# Patient Record
Sex: Male | Born: 1990 | Hispanic: No | Marital: Married | Smoking: Light tobacco smoker
Health system: Southern US, Community
[De-identification: ages and names within clinical notes are randomized; demographics above are authoritative.]

## PROBLEM LIST (undated history)

## (undated) DIAGNOSIS — F410 Panic disorder [episodic paroxysmal anxiety] without agoraphobia: Secondary | ICD-10-CM

## (undated) DIAGNOSIS — E079 Disorder of thyroid, unspecified: Secondary | ICD-10-CM

## (undated) HISTORY — PX: TESTICLE SURGERY: SHX794

---
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
## 2011-06-13 ENCOUNTER — Emergency Department (HOSPITAL_COMMUNITY)
Admission: EM | Admit: 2011-06-13 | Discharge: 2011-06-13 | Disposition: A | Discharge status: Home / Self Care | Payer: Self-pay | Attending: Emergency Medicine | Admitting: Emergency Medicine

## 2011-06-13 ENCOUNTER — Encounter (HOSPITAL_COMMUNITY): Payer: Self-pay | Admitting: Family Medicine

## 2011-06-13 ENCOUNTER — Emergency Department (HOSPITAL_COMMUNITY): Payer: Self-pay

## 2011-06-13 DIAGNOSIS — S93409A Sprain of unspecified ligament of unspecified ankle, initial encounter: Secondary | ICD-10-CM | POA: Insufficient documentation

## 2011-06-13 DIAGNOSIS — Z79899 Other long term (current) drug therapy: Secondary | ICD-10-CM | POA: Insufficient documentation

## 2011-06-13 DIAGNOSIS — W010XXA Fall on same level from slipping, tripping and stumbling without subsequent striking against object, initial encounter: Secondary | ICD-10-CM | POA: Insufficient documentation

## 2011-06-13 DIAGNOSIS — M25476 Effusion, unspecified foot: Secondary | ICD-10-CM | POA: Insufficient documentation

## 2011-06-13 DIAGNOSIS — Y9367 Activity, basketball: Secondary | ICD-10-CM | POA: Insufficient documentation

## 2011-06-13 DIAGNOSIS — M25579 Pain in unspecified ankle and joints of unspecified foot: Secondary | ICD-10-CM | POA: Insufficient documentation

## 2011-06-13 DIAGNOSIS — M25473 Effusion, unspecified ankle: Secondary | ICD-10-CM | POA: Insufficient documentation

## 2011-06-13 DIAGNOSIS — F17200 Nicotine dependence, unspecified, uncomplicated: Secondary | ICD-10-CM | POA: Insufficient documentation

## 2011-06-13 DIAGNOSIS — S93401A Sprain of unspecified ligament of right ankle, initial encounter: Secondary | ICD-10-CM

## 2011-06-13 MED ORDER — TRAMADOL HCL 50 MG PO TABS: 50.0000 mg | ORAL_TABLET | Freq: Four times a day (QID) | ORAL | Status: AC | PRN

## 2011-06-13 NOTE — ED Provider Notes (Signed)
Medical screening examination/treatment/procedure(s) were performed by non-physician practitioner and as supervising physician I was immediately available for consultation/collaboration.  Juliet Rude. Rubin Payor, MD 06/13/11 [phone]

## 2011-06-13 NOTE — ED Notes (Signed)
Patient states he twisted his ankle around 5pm. States pain was not bad at time of injury but got worse around 9pm. Pain with movement and weight bearing.

## 2011-06-13 NOTE — ED Provider Notes (Signed)
History     CSN: [id number]  Arrival date & time 06/13/11  0035   First MD Initiated Contact with Patient 06/13/11 0305      Chief Complaint  Patient presents with  . Ankle Injury    (Consider location/radiation/quality/duration/timing/severity/associated sxs/prior treatment) HPI Comments: Patient states he was playing basketball today and slipped on a wet spot twisting.  Right ankle.  There is no deformity or swelling at this time.  He states that he has sprained his ankle.  Multiple times in the past  Patient is a 21 y.o. male presenting with lower extremity injury. The history is provided by the patient.  Ankle Injury This is a new problem. The current episode started today. The problem occurs constantly. The problem has been unchanged. Associated symptoms include joint swelling.    History reviewed. No pertinent past medical history.  Past Surgical History  Procedure Date  . Testicle surgery     No family history on file.  History  Substance Use Topics  . Smoking status: Current Everyday Smoker -- 0.2 packs/day    Types: Cigarettes  . Smokeless tobacco: Not on file  . Alcohol Use: No      Review of Systems  Musculoskeletal: Positive for joint swelling. Negative for back pain.    Allergies  Ceclor  Home Medications   Current Outpatient Rx  Name Route Sig Dispense Refill  . IBUPROFEN 200 MG PO TABS Oral Take 800 mg by mouth every 8 (eight) hours as needed. For pain    . TRAMADOL HCL 50 MG PO TABS Oral Take 1 tablet (50 mg total) by mouth every 6 (six) hours as needed for pain. 15 tablet 0    Pulse 85  Temp 98.8 F (37.1 C)  Resp 18  Ht 5\' 6"  (1.676 m)  Wt 360 lb (163.295 kg)  BMI 58.11 kg/m2  SpO2 98%  Physical Exam  Constitutional: He appears well-developed and well-nourished.  HENT:  Head: Normocephalic.  Cardiovascular: Normal rate.   Musculoskeletal: He exhibits tenderness. He exhibits no edema.       Feet:    ED Course  Procedures  (including critical care time)  Labs Reviewed - No data to display Dg Ankle Complete Right  06/13/2011  *RADIOLOGY REPORT*  Clinical Data: Ankle injury.  Pain.  RIGHT ANKLE - COMPLETE 3+ VIEW  Comparison: None.  Findings: There is no evidence for fracture, subluxation or dislocation.  No worrisome lytic or sclerotic osseous lesion.  IMPRESSION: Normal exam.  Original Report Authenticated By: ERIC A. MANSELL, M.D.     1. Ankle sprain, right, initial encounter       MDM  Ankle sprain        Arman Filter, NP 06/13/11 0334  Arman Filter, NP 06/13/11 [phone]

## 2011-06-13 NOTE — ED Notes (Signed)
Patient transported to X-ray 

## 2011-06-13 NOTE — ED Notes (Signed)
Patient discharge via wheelchair. Respirations equal and unlabored. Skin warm and dry. No acute distress noted. 

## 2011-09-01 ENCOUNTER — Encounter (HOSPITAL_COMMUNITY): Payer: Self-pay | Admitting: *Deleted

## 2011-09-01 ENCOUNTER — Emergency Department (HOSPITAL_COMMUNITY)
Admission: EM | Admit: 2011-09-01 | Discharge: 2011-09-02 | Disposition: A | Discharge status: Home / Self Care | Payer: BC Managed Care – PPO | Attending: Emergency Medicine | Admitting: Emergency Medicine

## 2011-09-01 DIAGNOSIS — Y9312 Activity, springboard and platform diving: Secondary | ICD-10-CM | POA: Insufficient documentation

## 2011-09-01 DIAGNOSIS — R42 Dizziness and giddiness: Secondary | ICD-10-CM | POA: Insufficient documentation

## 2011-09-01 DIAGNOSIS — F17200 Nicotine dependence, unspecified, uncomplicated: Secondary | ICD-10-CM | POA: Insufficient documentation

## 2011-09-01 DIAGNOSIS — S060XAA Concussion with loss of consciousness status unknown, initial encounter: Secondary | ICD-10-CM | POA: Insufficient documentation

## 2011-09-01 DIAGNOSIS — W2209XA Striking against other stationary object, initial encounter: Secondary | ICD-10-CM | POA: Insufficient documentation

## 2011-09-01 DIAGNOSIS — S060X9A Concussion with loss of consciousness of unspecified duration, initial encounter: Secondary | ICD-10-CM

## 2011-09-01 DIAGNOSIS — Y9239 Other specified sports and athletic area as the place of occurrence of the external cause: Secondary | ICD-10-CM | POA: Insufficient documentation

## 2011-09-01 NOTE — ED Notes (Signed)
Pt reports hitting his head on the bottom of a pool today approximately 3 pm. Pt struck the back of his head, no laceration, hematoma noted. Pt denies LOC. Pt now reports headache and nausea onset at 8 pm. Pt denies vomiting. Pt reports dizziness and weakness. Pt is A&O x4. Skin is warm and dry. Pain is 5/10. Pt reports taking 2 extra strength tylenol two hours ago.

## 2011-09-02 ENCOUNTER — Emergency Department (HOSPITAL_COMMUNITY): Payer: BC Managed Care – PPO

## 2011-09-02 NOTE — ED Provider Notes (Signed)
History     CSN: [id number]  Arrival date & time 09/01/11  2209   First MD Initiated Contact with Patient 09/01/11 2350      No chief complaint on file.   (Consider location/radiation/quality/duration/timing/severity/associated sxs/prior treatment) HPI  Patient presents to the emergency department with complaints of feeling dizzy, having a headache, and head injury. The patient was swimming earlier today his head on the bottom of the pool after diving in. He denies loss of consciousness but since then he states that he has been feeling dizzy in a week. His parents are here with him and say that he's been acting insulin where. Patient denies having hematoma or laceration over the site of impact. He denies having any pain in his neck or elsewhere. The patient is ambulatory. The patient is answering all my questions appropriately and is alert and oriented x3.  History reviewed. No pertinent past medical history.  Past Surgical History  Procedure Date  . Testicle surgery     History reviewed. No pertinent family history.  History  Substance Use Topics  . Smoking status: Current Everyday Smoker -- 0.2 packs/day    Types: Cigarettes  . Smokeless tobacco: Not on file  . Alcohol Use: No      Review of Systems  HEENT: denies blurry vision or change in hearing PULMONARY: Denies difficulty breathing and SOB CARDIAC: denies chest pain or heart palpitations MUSCULOSKELETAL:  denies being unable to ambulate ABDOMEN AL: denies abdominal pain GU: denies loss of bowel or urinary control NEURO: denies numbness and tingling in extremities SKIN: no new rashes PSYCH: patient denies anxiety or depression. NECK: Pt denies having neck pain    Allergies  Ceclor  Home Medications   Current Outpatient Rx  Name Route Sig Dispense Refill  . ACETAMINOPHEN 500 MG PO TABS Oral Take 1,000 mg by mouth every 6 (six) hours as needed. Pain.    Marland Kitchen CETIRIZINE HCL 10 MG PO TABS Oral Take 10 mg by  mouth daily.      BP 149/97  Pulse 99  Temp 99.5 F (37.5 C) (Oral)  Resp 18  SpO2 100%  Physical Exam  Nursing note and vitals reviewed. Constitutional: He is oriented to person, place, and time. He appears well-developed and well-nourished. No distress.  HENT:  Head: Normocephalic. Head is with contusion. Head is without raccoon's eyes, without Battle's sign, without abrasion and without laceration. Hair is normal.    Eyes: Pupils are equal, round, and reactive to light.  Neck: Normal range of motion. Neck supple.  Cardiovascular: Normal rate and regular rhythm.   Pulmonary/Chest: Effort normal.  Abdominal: Soft.  Neurological: He is alert and oriented to person, place, and time. He has normal strength. No cranial nerve deficit (3-12 intact) or sensory deficit. He displays a negative Romberg sign.  Skin: Skin is warm and dry.    ED Course  Procedures (including critical care time)  Labs Reviewed - No data to display No results found.   1. Concussion       MDM  Pt most likely has concussion but parents request head CT and I do not think that is completely unreasonable do to headache, vomiting and weakness.    Patient head CT was normal, will treat as a concussion. I have advised parents to keep a close eye on him. He needs to stay away from contact activities for two weeks.  Pt needs to follow-up with PCP in the next 7-10 days for re-evaluation.  Pt has been  advised of the symptoms that warrant their return to the ED. Patient has voiced understanding and has agreed to follow-up with the PCP or specialist.           Dorthula Matas, PA 09/02/11 [phone]

## 2011-09-03 NOTE — ED Provider Notes (Signed)
Medical screening examination/treatment/procedure(s) were performed by non-physician practitioner and as supervising physician I was immediately available for consultation/collaboration.    Vida Roller, MD 09/03/11 [phone]

## 2011-09-18 ENCOUNTER — Emergency Department (HOSPITAL_COMMUNITY): Payer: BC Managed Care – PPO

## 2011-09-18 ENCOUNTER — Emergency Department (HOSPITAL_COMMUNITY)
Admission: EM | Admit: 2011-09-18 | Discharge: 2011-09-18 | Disposition: A | Discharge status: Home / Self Care | Payer: BC Managed Care – PPO | Attending: Emergency Medicine | Admitting: Emergency Medicine

## 2011-09-18 ENCOUNTER — Encounter (HOSPITAL_COMMUNITY): Payer: Self-pay | Admitting: Emergency Medicine

## 2011-09-18 DIAGNOSIS — F410 Panic disorder [episodic paroxysmal anxiety] without agoraphobia: Secondary | ICD-10-CM

## 2011-09-18 DIAGNOSIS — F17200 Nicotine dependence, unspecified, uncomplicated: Secondary | ICD-10-CM | POA: Insufficient documentation

## 2011-09-18 LAB — POCT I-STAT, CHEM 8
BUN: 9 mg/dL (ref 6–23)
Calcium, Ion: 1.16 mmol/L (ref 1.12–1.23)
HCT: 45 % (ref 39.0–52.0)
Hemoglobin: 15.3 g/dL (ref 13.0–17.0)
Sodium: 140 mEq/L (ref 135–145)
TCO2: 21 mmol/L (ref 0–100)

## 2011-09-18 MED ORDER — ACETAMINOPHEN 500 MG PO TABS
1000.0000 mg | ORAL_TABLET | Freq: Once | ORAL | Status: AC
  Administered 2011-09-18: 1000 mg via ORAL
  Filled 2011-09-18: qty 2

## 2011-09-18 NOTE — ED Provider Notes (Signed)
Date: 09/18/2011  Rate: 86  Rhythm: normal sinus rhythm  QRS Axis: normal  Intervals: normal  ST/T Wave abnormalities: normal  Conduction Disutrbances:none  Narrative Interpretation: Borderline left atrial hypertrophy, otherwise normal ECG. No prior ECG available for comparison.  Old EKG Reviewed: none available    Dione Booze, MD 09/18/11 [phone]

## 2011-09-18 NOTE — ED Notes (Signed)
Pt presenting to ed with c/o panic attack with chest pain, headache pain, and some nausea, pt denies vomiting. Pt states positive shortness of breath. Pt is alert and oriented at this time

## 2011-09-18 NOTE — ED Provider Notes (Signed)
History     CSN: [id number]  Arrival date & time 09/18/11  1259   First MD Initiated Contact with Patient 09/18/11 1403      Chief Complaint  Patient presents with  . Panic Attack  . Headache    (Consider location/radiation/quality/duration/timing/severity/associated sxs/prior treatment) HPI Comments: Pt presents with shortness of breath. Pt reports that this morning he woke up around 10:30am and had a "funny, fuzzy" feeling in his head and chest, as well as mild nausea. He states he felt slightly lightheaded. 1 hour later, patient states he was hanging around the house and felt a sudden burning in the center of his chest as well as shortness of breath and felt as though his heart was racing. The chest pain did not radiate anywhere and lasted for about 15 minutes. He states he felt like he could not take a deep breath and fill his chest with air, but denies any pain with deep inspiration. The patient thought that he may be having a panic attack, he states that he has had similar episodes of shortness of breath and palpitations, but has not experienced the chest pain before.  Pt notes he does not carry a diagnosis of anxiety but his mother believes he is an anxious person.  Pt also has environmental allergies, but does not know to what.  States this occurs approximately 3-4 times/month and can happen at any time of day, at rest, and no matter what he is doing.  Pt walked around outside and the chest pain subsided but the shortness of breath and nausea lasted until about 1:00pm. Upon arriving to ED, pt states his symptoms had resolved but has acquired a mild, constant headache in the back of his head. Pt reports diarrhea for the past week, about 2 times per day. Denies fever, chills, leg pain or swelling, constipation, abdominal pain, vomiting, urinary symptoms, blood in stool or urine.  Pt has had no recent immobilization, no personal or family hx PE.  No family hx CAD.    Patient is a 21 y.o. male  presenting with headaches. The history is provided by the patient.  Headache  Associated symptoms include palpitations, shortness of breath and nausea. Pertinent negatives include no fever and no vomiting.    History reviewed. No pertinent past medical history.  Past Surgical History  Procedure Date  . Testicle surgery     No family history on file.  History  Substance Use Topics  . Smoking status: Current Everyday Smoker -- 0.2 packs/day    Types: Cigarettes  . Smokeless tobacco: Not on file  . Alcohol Use: No      Review of Systems  Constitutional: Negative for fever and chills.  Respiratory: Positive for shortness of breath.   Cardiovascular: Positive for chest pain and palpitations. Negative for leg swelling.  Gastrointestinal: Positive for nausea and diarrhea. Negative for vomiting, abdominal pain, constipation and blood in stool.  Genitourinary: Negative for dysuria, urgency and frequency.  Musculoskeletal: Negative for myalgias.  Neurological: Positive for light-headedness and headaches. Negative for dizziness.    Allergies  Ceclor  Home Medications   Current Outpatient Rx  Name Route Sig Dispense Refill  . IBUPROFEN 200 MG PO TABS Oral Take 200-800 mg by mouth every 6 (six) hours as needed.      BP 135/74  Pulse 94  Temp 99 F (37.2 C) (Oral)  Resp 20  SpO2 97%  Physical Exam  Nursing note and vitals reviewed. Constitutional: He appears well-developed and well-nourished.  No distress.  HENT:  Head: Normocephalic and atraumatic.  Neck: Neck supple.  Cardiovascular: Normal rate, regular rhythm and normal pulses.        No peripheral edema  Pulmonary/Chest: Effort normal and breath sounds normal. No respiratory distress. He has no wheezes. He has no rales. He exhibits no tenderness.  Abdominal: Soft. He exhibits no distension. There is no tenderness. There is no rebound and no guarding.       Morbidly obese  Neurological: He is alert. He exhibits  normal muscle tone.  Skin: He is not diaphoretic.  Psychiatric: He has a normal mood and affect. His behavior is normal.    ED Course  Procedures (including critical care time)   Labs Reviewed  POCT I-STAT, CHEM 8   Dg Chest 2 View  09/18/2011  *RADIOLOGY REPORT*  Clinical Data: Chest pain  CHEST - 2 VIEW  Comparison: None.  Findings: The heart and pulmonary vascularity are within normal limits.  The lungs are clear bilaterally.  No acute bony abnormality is seen.  IMPRESSION: No acute abnormality.  Original Report Authenticated By: Phillips Odor, M.D.    3:20 PM PERC negative.     Date: 09/18/2011  Rate: 86  Rhythm: normal sinus rhythm  QRS Axis: normal  Intervals: normal  ST/T Wave abnormalities: normal  Conduction Disutrbances: none  Narrative Interpretation:   Old EKG Reviewed: none available   1. Panic attack     MDM  Pt reports recurrent episode of what he believes is a panic attack - occurs 3-4 times/month, has sudden onset of shortness of breath and palpitations, today with chest burning.  Symptoms were resolved prior to arrival to ED.  Patient's EKG and CXR are normal.  Patient is PERC negative, no risk factors or family or personal history for PE.  Pt has no family hx CAD and symptoms were relieved with exertion - doubt CAD.  Pt notes he may have an undiagnosed anxiety disorder, but was not aware of being particularly anxious at the time the event occurred.  I have encouraged close PCP follow up.  Discussed all results with patient.  Pt given return precautions.  Pt verbalizes understanding and agrees with plan.           [place], [place] 09/18/11 1755

## 2011-09-18 NOTE — ED Notes (Signed)
Patient states panic attacks started 7 months ago since separating from his wife-patient calm/cooperative-no complaints at this time

## 2011-09-19 NOTE — ED Provider Notes (Signed)
Medical screening examination/treatment/procedure(s) were performed by non-physician practitioner and as supervising physician I was immediately available for consultation/collaboration.   Leeanne Butters, MD 09/19/11 0720 

## 2017-11-07 ENCOUNTER — Emergency Department (HOSPITAL_COMMUNITY)
Admission: EM | Admit: 2017-11-07 | Discharge: 2017-11-07 | Disposition: A | Discharge status: Home / Self Care | Payer: PRIVATE HEALTH INSURANCE | Attending: Emergency Medicine | Admitting: Emergency Medicine

## 2017-11-07 ENCOUNTER — Encounter (HOSPITAL_COMMUNITY): Payer: Self-pay | Admitting: Emergency Medicine

## 2017-11-07 DIAGNOSIS — R002 Palpitations: Secondary | ICD-10-CM | POA: Insufficient documentation

## 2017-11-07 DIAGNOSIS — F17210 Nicotine dependence, cigarettes, uncomplicated: Secondary | ICD-10-CM | POA: Diagnosis not present

## 2017-11-07 DIAGNOSIS — R946 Abnormal results of thyroid function studies: Secondary | ICD-10-CM | POA: Insufficient documentation

## 2017-11-07 DIAGNOSIS — R7989 Other specified abnormal findings of blood chemistry: Secondary | ICD-10-CM

## 2017-11-07 LAB — CBC WITH DIFFERENTIAL/PLATELET
ABS IMMATURE GRANULOCYTES: 0.1 10*3/uL (ref 0.0–0.1)
BASOS PCT: 1 %
Basophils Absolute: 0.1 10*3/uL (ref 0.0–0.1)
Eosinophils Absolute: 0.1 10*3/uL (ref 0.0–0.7)
Eosinophils Relative: 2 %
HEMATOCRIT: 40.5 % (ref 39.0–52.0)
HEMOGLOBIN: 13.3 g/dL (ref 13.0–17.0)
Immature Granulocytes: 1 %
LYMPHS ABS: 2.2 10*3/uL (ref 0.7–4.0)
LYMPHS PCT: 31 %
MCH: 27.4 pg (ref 26.0–34.0)
MCHC: 32.8 g/dL (ref 30.0–36.0)
MCV: 83.5 fL (ref 78.0–100.0)
MONO ABS: 0.5 10*3/uL (ref 0.1–1.0)
MONOS PCT: 7 %
NEUTROS ABS: 4.1 10*3/uL (ref 1.7–7.7)
Neutrophils Relative %: 58 %
Platelets: 307 10*3/uL (ref 150–400)
RBC: 4.85 MIL/uL (ref 4.22–5.81)
RDW: 13.3 % (ref 11.5–15.5)
WBC: 7 10*3/uL (ref 4.0–10.5)

## 2017-11-07 LAB — BASIC METABOLIC PANEL
ANION GAP: 12 (ref 5–15)
BUN: 11 mg/dL (ref 6–20)
CALCIUM: 9.5 mg/dL (ref 8.9–10.3)
CO2: 22 mmol/L (ref 22–32)
Chloride: 105 mmol/L (ref 98–111)
Creatinine, Ser: 0.85 mg/dL (ref 0.61–1.24)
Glucose, Bld: 101 mg/dL — ABNORMAL HIGH (ref 70–99)
Potassium: 3.5 mmol/L (ref 3.5–5.1)
Sodium: 139 mmol/L (ref 135–145)

## 2017-11-07 LAB — TSH: TSH: 8.141 u[IU]/mL — AB (ref 0.350–4.500)

## 2017-11-07 LAB — I-STAT TROPONIN, ED: Troponin i, poc: 0 ng/mL (ref 0.00–0.08)

## 2017-11-07 LAB — T4, FREE: Free T4: 0.85 ng/dL (ref 0.82–1.77)

## 2017-11-07 NOTE — ED Provider Notes (Signed)
[facility] EMERGENCY DEPARTMENT Provider Note   CSN: [id number] Arrival date & time: 11/07/17  4098     History   Chief Complaint Chief Complaint  Patient presents with  . Palpitations    HPI Xaviar Lunn is a 27 y.o. male.  The history is provided by the patient and medical records. No language interpreter was used.  Palpitations   Associated symptoms include chest pain and shortness of breath. Pertinent negatives include no cough.   Rito Lecomte is a 27 y.o. male  with a PMH of panic attacks who presents to the Emergency Department complaining of palpitations that began around 4 am this morning. Patient states that he was about to go to sleep at 4 this morning, when he suddenly felt very funny - describes this sensation as his chest feeling "light, like weightless".  He thought that he may be having a panic attack, as he has had a few of these in the past, therefore he went to take a warm shower.  He tried to relax, but his symptoms continued to get worse.  He states that he felt jittery, excited, shaking and could not sit still.  He was pacing around the house.  He has a home heart rate monitor that goes on his finger.  He checked it and his heart rate was 145.  Significant other recommended that he come to the emergency department.  He states that the symptoms lasted about 30 minutes to an hour and resolved shortly before entering the emergency department today.  He currently feels a little jittery still, but otherwise normal.  Denies any chest pain currently.  No shortness of breath currently either.  He denies any new medications, supplements or changes in diet such as caffeine or workout drinks.  No recent surgeries/travel/immobilizations.   History reviewed. No pertinent past medical history.  There are no active problems to display for this patient.   Past Surgical History:  Procedure Laterality Date  . TESTICLE SURGERY          Home Medications      Prior to Admission medications   Not on File    Family History No family history on file.  Social History Social History   Tobacco Use  . Smoking status: Light Tobacco Smoker    Packs/day: 0.25    Types: E-cigarettes  Substance Use Topics  . Alcohol use: No  . Drug use: No     Allergies   Ceclor [cefaclor]   Review of Systems Review of Systems  Respiratory: Positive for shortness of breath. Negative for cough and wheezing.   Cardiovascular: Positive for chest pain and palpitations. Negative for leg swelling.  All other systems reviewed and are negative.    Physical Exam Updated Vital Signs BP 108/70   Pulse 61   Temp 98.1 F (36.7 C)   Resp (!) 25   SpO2 96%   Physical Exam  Constitutional: He is oriented to person, place, and time. He appears well-developed and well-nourished. No distress.  HENT:  Head: Normocephalic and atraumatic.  Cardiovascular: Normal rate, regular rhythm and normal heart sounds.  No murmur heard. Regular rate and rhythm on exam.  Heart rate in the 90s on cardiac monitoring.  Pulmonary/Chest: Effort normal and breath sounds normal. No respiratory distress. He has no wheezes. He has no rales.  Abdominal: Soft. He exhibits no distension. There is no tenderness.  Musculoskeletal: He exhibits no edema.  Neurological: He is alert and oriented to person, place,  and time.  Skin: Skin is warm and dry.  Nursing note and vitals reviewed.    ED Treatments / Results  Labs (all labs ordered are listed, but only abnormal results are displayed) Labs Reviewed  BASIC METABOLIC PANEL - Abnormal; Notable for the following components:      Result Value   Glucose, Bld 101 (*)    All other components within normal limits  TSH - Abnormal; Notable for the following components:   TSH 8.141 (*)    All other components within normal limits  CBC WITH DIFFERENTIAL/PLATELET  T4, FREE  T3, FREE  I-STAT TROPONIN, ED    EKG EKG  Interpretation  Date/Time:  Saturday November 07 2017 06:19:07 EDT Ventricular Rate:  102 PR Interval:  148 QRS Duration: 84 QT Interval:  330 QTC Calculation: 430 R Axis:   40 Text Interpretation:  Sinus tachycardia Otherwise normal ECG Confirmed by Geoffery Lyons ([id number]) on 11/07/2017 6:52:43 AM   Radiology No results found.  Procedures Procedures (including critical care time)  Medications Ordered in ED Medications - No data to display   Initial Impression / Assessment and Plan / ED Course  I have reviewed the triage vital signs and the nursing notes.  Pertinent labs & imaging results that were available during my care of the patient were reviewed by me and considered in my medical decision making (see chart for details).    Selso Mannor is a 27 y.o. male who presents to ED for palpitations and not feeling well around 4 am this morning. Patient reports feeling jittery as well. He used home HR monitor where HR was noted to be 145, therefore he came to ER.  Upon arrival to the emergency department, EKG with sinus tachycardia, but otherwise normal.  Heart rate on EKG of 102.  On my examination, heart rate in the low 90s with regular rate and rhythm on exam.  Remainder of vitals reassuring. Labs reviewed: normal trop. TSH actually elevated: 8.141. Free T4 wdl. T3 sent. Patient aware that he will need PCP follow up for further evaluation / management given elevated TSH. Also aware PCP will need to follow up on T3. [facility] referral info given. I also gave significant other [facility] PCP contact information. Evaluation does not show pathology that would require ongoing emergent intervention or inpatient treatment. Reasons to return to ER discussed. All questions answered.    Final Clinical Impressions(s) / ED Diagnoses   Final diagnoses:  Palpitations  Elevated TSH    ED Discharge Orders    None       Ward, Chase Picket, PA-C 11/07/17 1439    Maia Plan, MD 11/07/17 [phone]

## 2017-11-07 NOTE — ED Notes (Signed)
Patient verbalizes understanding of discharge instructions. Opportunity for questioning and answers were provided. Armband removed by staff, pt discharged from ED ambulatory.   

## 2017-11-07 NOTE — Discharge Instructions (Signed)
It was my pleasure taking care of you today!   Your thyroid test (TSH) was elevated today. You will need to see a primary care doctor in follow up for further management of this. You can call one of the [place] clinics as we discussed. If you cannot get an appointment, you can call the clinic listed.   Return to ER for new or worsening symptoms, any additional concerns.

## 2017-11-07 NOTE — ED Notes (Signed)
Family at bedside. 

## 2017-11-07 NOTE — ED Triage Notes (Addendum)
Pt reports feeling like his heart was racing and he was having trouble breathing, began around 4am, states he checked his HR at home and it was 145. Has hx of anxiety but reports he can typically sleep it off, states this feels different. Patient tremulous in triage and states he feels very cold.

## 2017-11-08 LAB — T3, FREE: T3, Free: 3.1 pg/mL (ref 2.0–4.4)

## 2017-12-11 ENCOUNTER — Other Ambulatory Visit: Payer: Self-pay

## 2017-12-11 ENCOUNTER — Emergency Department (HOSPITAL_COMMUNITY)
Admission: EM | Admit: 2017-12-11 | Discharge: 2017-12-11 | Disposition: A | Discharge status: Home / Self Care | Payer: 59 | Attending: Emergency Medicine | Admitting: Emergency Medicine

## 2017-12-11 ENCOUNTER — Encounter (HOSPITAL_COMMUNITY): Payer: Self-pay

## 2017-12-11 ENCOUNTER — Emergency Department (HOSPITAL_COMMUNITY): Payer: 59

## 2017-12-11 DIAGNOSIS — R0789 Other chest pain: Secondary | ICD-10-CM | POA: Diagnosis present

## 2017-12-11 DIAGNOSIS — F410 Panic disorder [episodic paroxysmal anxiety] without agoraphobia: Secondary | ICD-10-CM | POA: Diagnosis not present

## 2017-12-11 DIAGNOSIS — F17290 Nicotine dependence, other tobacco product, uncomplicated: Secondary | ICD-10-CM | POA: Insufficient documentation

## 2017-12-11 HISTORY — DX: Disorder of thyroid, unspecified: E07.9

## 2017-12-11 LAB — CBC
HEMATOCRIT: 38.8 % — AB (ref 39.0–52.0)
Hemoglobin: 12.9 g/dL — ABNORMAL LOW (ref 13.0–17.0)
MCH: 27.4 pg (ref 26.0–34.0)
MCHC: 33.2 g/dL (ref 30.0–36.0)
MCV: 82.6 fL (ref 80.0–100.0)
Platelets: 304 10*3/uL (ref 150–400)
RBC: 4.7 MIL/uL (ref 4.22–5.81)
RDW: 13.4 % (ref 11.5–15.5)
WBC: 7.6 10*3/uL (ref 4.0–10.5)
nRBC: 0 % (ref 0.0–0.2)

## 2017-12-11 LAB — I-STAT TROPONIN, ED
Troponin i, poc: 0 ng/mL (ref 0.00–0.08)
Troponin i, poc: 0 ng/mL (ref 0.00–0.08)

## 2017-12-11 LAB — BASIC METABOLIC PANEL
ANION GAP: 7 (ref 5–15)
BUN: 18 mg/dL (ref 6–20)
CO2: 25 mmol/L (ref 22–32)
CREATININE: 0.96 mg/dL (ref 0.61–1.24)
Calcium: 8.9 mg/dL (ref 8.9–10.3)
Chloride: 103 mmol/L (ref 98–111)
GFR calc non Af Amer: >60 mL/min (ref >60)
GLUCOSE: 109 mg/dL — AB (ref 70–99)
Potassium: 3.4 mmol/L — ABNORMAL LOW (ref 3.5–5.1)
Sodium: 135 mmol/L (ref 135–145)

## 2017-12-11 MED ORDER — HYDROXYZINE HCL 25 MG PO TABS: 25.0000 mg | ORAL_TABLET | Freq: Four times a day (QID) | ORAL | 0 refills | Status: DC | PRN

## 2017-12-11 NOTE — ED Notes (Signed)
Lab reports not receiving blood work. States they have had an issue with the tube station and is contacting facilities. Blood work to be re drawn

## 2017-12-11 NOTE — ED Provider Notes (Signed)
[facility] EMERGENCY DEPARTMENT Provider Note   CSN: [id number] Arrival date & time: 12/11/17  2010     History   Chief Complaint Chief Complaint  Patient presents with  . Chest Pain  . Shortness of Breath    HPI Aaron Wheeler is a 27 y.o. male senting for evaluation of chest pain or shortness of breath.  Patient states he was trying to arrange follow-up for his medical care when he had acute onset chest pain and shortness of breath.  Chest pain is central, described as a burning.  He denies radiation.  Pain was severe for an hour, before improving.  He had associated shortness of breath.  He did not take anything for his symptoms.  Nothing made the pain better or worse.  Patient states he was recently diagnosed with hypothyroidism, has not yet followed up.  When he was diagnosed with hypothyroidism, he was found to have palpitations with a heart rate up to 140.  Additionally, patient has been having frequent anxiety attacks.  Patient states he vapes daily, denies alcohol or drug use.  He denies fevers, chills, cough, nausea, vomiting, abdominal pain, urinary symptoms, abnormal bowel movements.  He denies leg pain or swelling.  He denies recent travel, surgeries, immobilization, history of cancer, history of previous DVT/PE.  He denies history of hypertension, diabetes, hyperlipidemia.  Denies previous heart problems.  Denies family h/o heart problems.   HPI  Past Medical History:  Diagnosis Date  . Thyroid disease    hyperthyroidsm    There are no active problems to display for this patient.   Past Surgical History:  Procedure Laterality Date  . TESTICLE SURGERY          Home Medications    Prior to Admission medications   Medication Sig Start Date End Date Taking? Authorizing Provider  naproxen sodium (ALEVE) 220 MG tablet Take 660 mg by mouth daily as needed (pain).   Yes [provider]    Family History No family history on  file.  Social History Social History   Tobacco Use  . Smoking status: Light Tobacco Smoker    Packs/day: 0.25    Types: E-cigarettes  Substance Use Topics  . Alcohol use: No  . Drug use: No     Allergies   Ceclor [cefaclor]   Review of Systems Review of Systems  Respiratory: Positive for shortness of breath.   Cardiovascular: Positive for chest pain.  All other systems reviewed and are negative.    Physical Exam Updated Vital Signs BP 125/71   Pulse 73   Temp 98.5 F (36.9 C) (Oral)   Resp 16   Ht 5\' 6"  (1.676 m)   Wt (!) 170.1 kg   SpO2 97%   BMI 60.53 kg/m   Physical Exam  Constitutional: He is oriented to person, place, and time. He appears well-developed and well-nourished. No distress.  Obese male sitting comfortably in the bed in no acute distress  HENT:  Head: Normocephalic and atraumatic.  Eyes: Pupils are equal, round, and reactive to light. Conjunctivae and EOM are normal.  Neck: Normal range of motion. Neck supple.  Cardiovascular: Normal rate, regular rhythm and intact distal pulses.  Pulmonary/Chest: Effort normal and breath sounds normal. No respiratory distress. He has no wheezes.  Speaking in full sentences.  Clear lung sounds in all fields.  Abdominal: Soft. He exhibits no distension and no mass. There is no tenderness. There is no guarding.  Musculoskeletal: Normal range of motion.  Neurological: He is alert and oriented to person, place, and time.  Skin: Skin is warm and dry. Capillary refill takes less than 2 seconds.  Psychiatric: He has a normal mood and affect.  Nursing note and vitals reviewed.    ED Treatments / Results  Labs (all labs ordered are listed, but only abnormal results are displayed) Labs Reviewed  BASIC METABOLIC PANEL  CBC  I-STAT TROPONIN, ED  I-STAT TROPONIN, ED    EKG EKG Interpretation  Date/Time:  Friday December 11 2017 20:20:51 EST Ventricular Rate:  82 PR Interval:  160 QRS Duration: 90 QT  Interval:  346 QTC Calculation: 404 R Axis:   2 Text Interpretation:  Normal sinus rhythm Normal ECG No significant change since last tracing Confirmed by Shaune Pollack [phone]) on 12/11/2017 9:24:22 PM   Radiology No results found.  Procedures Procedures (including critical care time)  Medications Ordered in ED Medications - No data to display   Initial Impression / Assessment and Plan / ED Course  I have reviewed the triage vital signs and the nursing notes.  Pertinent labs & imaging results that were available during my care of the patient were reviewed by me and considered in my medical decision making (see chart for details).     Pt presenting for evaluation of chest pain shortness of breath.  Physical exam reassuring, he is afebrile not tachycardic.  Appears nontoxic.  Pain is improving.  As patient is having chest pain, will obtain basic labs, troponin, EKG. Chest x-ray for shortness of breath.  Initial troponin negative.  EKG without STEMI.  X-ray and labs pending.  Low suspicion for ACS at this time.  Low suspicion for PE.  If negative, plan to have patient follow-up with primary care for management of his thyroid and further evaluation of his symptoms.  Patient signed out to CIsaacs, MD for follow-up on labs and x-ray.  Final Clinical Impressions(s) / ED Diagnoses   Final diagnoses:  None    ED Discharge Orders    None       Alveria Apley, PA-C 12/11/17 2224    Shaune Pollack, MD 12/12/17 1955

## 2017-12-11 NOTE — ED Notes (Signed)
Patient transported to X-ray 

## 2017-12-11 NOTE — Discharge Instructions (Addendum)
Take Tylenol as needed for chest pain. Follow-up with primary care for further management of your symptoms and your thyroid.  Call the [phone] number in the back of the paperwork to help you set up primary care. Return to the emergency room with any new, worsening, concerning symptoms.

## 2017-12-11 NOTE — ED Triage Notes (Signed)
Pt c.o Central CP and SOB that started around 6pm today, describes at burning and radiates to back, 5/10 pain. Denies nausea, pt a.o, nad noted

## 2017-12-11 NOTE — ED Notes (Signed)
ED Provider at bedside. 

## 2017-12-11 NOTE — ED Notes (Signed)
Patient verbalizes understanding of discharge instructions. Opportunity for questioning and answers were provided. Armband removed by staff, pt discharged from ED.  

## 2018-11-16 ENCOUNTER — Other Ambulatory Visit: Payer: Self-pay

## 2018-11-16 DIAGNOSIS — Z20822 Contact with and (suspected) exposure to covid-19: Secondary | ICD-10-CM

## 2018-11-18 LAB — NOVEL CORONAVIRUS, NAA: SARS-CoV-2, NAA: NOT DETECTED

## 2019-09-12 ENCOUNTER — Other Ambulatory Visit: Payer: Self-pay | Admitting: Family Medicine

## 2019-09-12 DIAGNOSIS — R1032 Left lower quadrant pain: Secondary | ICD-10-CM

## 2019-09-20 ENCOUNTER — Ambulatory Visit
Admission: RE | Admit: 2019-09-20 | Discharge: 2019-09-20 | Disposition: A | Discharge status: Home / Self Care | Payer: 59 | Source: Ambulatory Visit | Attending: Family Medicine | Admitting: Family Medicine

## 2019-09-20 ENCOUNTER — Other Ambulatory Visit: Payer: Self-pay | Admitting: Family Medicine

## 2019-09-20 DIAGNOSIS — R1032 Left lower quadrant pain: Secondary | ICD-10-CM

## 2020-06-19 ENCOUNTER — Emergency Department (HOSPITAL_BASED_OUTPATIENT_CLINIC_OR_DEPARTMENT_OTHER)
Admission: EM | Admit: 2020-06-19 | Discharge: 2020-06-19 | Disposition: A | Discharge status: Home / Self Care | Payer: 59 | Attending: Emergency Medicine | Admitting: Emergency Medicine

## 2020-06-19 ENCOUNTER — Encounter (HOSPITAL_BASED_OUTPATIENT_CLINIC_OR_DEPARTMENT_OTHER): Payer: Self-pay

## 2020-06-19 ENCOUNTER — Other Ambulatory Visit: Payer: Self-pay

## 2020-06-19 DIAGNOSIS — F17290 Nicotine dependence, other tobacco product, uncomplicated: Secondary | ICD-10-CM | POA: Insufficient documentation

## 2020-06-19 DIAGNOSIS — H65191 Other acute nonsuppurative otitis media, right ear: Secondary | ICD-10-CM

## 2020-06-19 DIAGNOSIS — H9201 Otalgia, right ear: Secondary | ICD-10-CM | POA: Diagnosis present

## 2020-06-19 NOTE — ED Provider Notes (Signed)
[facility] EMERGENCY DEPT Provider Note   CSN: [id number] Arrival date & time: 06/19/20  1834     History No chief complaint on file.   Aaron Wheeler is a 30 y.o. male.  The history is provided by the patient.  Otalgia Location:  Bilateral (R>L) Behind ear:  No abnormality Quality:  Throbbing Severity:  Moderate Onset quality:  Gradual Duration:  4 weeks Timing:  Constant Progression:  Waxing and waning Chronicity:  New Context: not recent URI   Relieved by:  Nothing Worsened by:  Nothing Ineffective treatments: Uses Flonase, Claritin D. Associated symptoms: congestion (mild) and hearing loss (feels like he is under water)   Associated symptoms: no abdominal pain, no cough, no fever, no headaches, no rash, no sore throat and no vomiting        Past Medical History:  Diagnosis Date  . Thyroid disease    hyperthyroidsm    There are no problems to display for this patient.   Past Surgical History:  Procedure Laterality Date  . TESTICLE SURGERY         No family history on file.  Social History   Tobacco Use  . Smoking status: Light Tobacco Smoker    Packs/day: 0.25    Types: E-cigarettes  Substance Use Topics  . Alcohol use: No  . Drug use: No    Home Medications Prior to Admission medications   Medication Sig Start Date End Date Taking? Authorizing Provider  hydrOXYzine (ATARAX/VISTARIL) 25 MG tablet Take 1 tablet (25 mg total) by mouth every 6 (six) hours as needed for anxiety. 12/11/17   Shaune Pollack, MD  naproxen sodium (ALEVE) 220 MG tablet Take 660 mg by mouth daily as needed (pain).    [provider]    Allergies    Ceclor [cefaclor]  Review of Systems   Review of Systems  Constitutional: Negative for chills and fever.  HENT: Positive for congestion (mild), ear pain and hearing loss (feels like he is under water). Negative for sore throat.   Eyes: Negative for pain and visual disturbance.  Respiratory:  Negative for cough and shortness of breath.   Cardiovascular: Negative for chest pain and palpitations.  Gastrointestinal: Negative for abdominal pain and vomiting.  Genitourinary: Negative for dysuria and hematuria.  Musculoskeletal: Negative for arthralgias and back pain.  Skin: Negative for color change and rash.  Neurological: Negative for seizures, syncope and headaches.  All other systems reviewed and are negative.   Physical Exam Updated Vital Signs BP 132/86 (BP Location: Left Arm)   Pulse (!) 107   Temp 98.3 F (36.8 C) (Oral)   Resp 18   Ht 5\' 6"  (1.676 m)   Wt (!) 181.4 kg   SpO2 99%   BMI 64.56 kg/m   Physical Exam Vitals and nursing note reviewed.  Constitutional:      Appearance: Normal appearance.  HENT:     Head: Normocephalic and atraumatic.     Comments: Serous effusion right ear    Right Ear: External ear normal.     Left Ear: Tympanic membrane, ear canal and external ear normal.     Nose: Nose normal.     Mouth/Throat:     Mouth: Mucous membranes are moist.     Pharynx: Oropharynx is clear.  Eyes:     Conjunctiva/sclera: Conjunctivae normal.  Pulmonary:     Effort: Pulmonary effort is normal. No respiratory distress.  Musculoskeletal:        General: No deformity. Normal range  of motion.     Cervical back: Normal range of motion.  Skin:    General: Skin is warm and dry.  Neurological:     General: No focal deficit present.     Mental Status: He is alert and oriented to person, place, and time. Mental status is at baseline.  Psychiatric:        Mood and Affect: Mood normal.     ED Results / Procedures / Treatments   Labs (all labs ordered are listed, but only abnormal results are displayed) Labs Reviewed - No data to display  EKG None  Radiology No results found.  Procedures Procedures   Medications Ordered in ED Medications - No data to display  ED Course  I have reviewed the triage vital signs and the nursing  notes.  Pertinent labs & imaging results that were available during my care of the patient were reviewed by me and considered in my medical decision making (see chart for details).    MDM Rules/Calculators/A&P                          Jacquese Hackman is a middle ear effusion unresponsive to conservative treatment.  No indication for antibiotics.  I have asked him to follow with ear nose and throat. Final Clinical Impression(s) / ED Diagnoses Final diagnoses:  Acute effusion of right ear    Rx / DC Orders ED Discharge Orders    None       Koleen Distance, MD 06/19/20 2041

## 2020-06-19 NOTE — ED Triage Notes (Signed)
Pt c/o of Ear Pain d/t his ears being clogged up. Pt states this has been going on for 4weeks. Pt states he has had telehealth visits, and started naproxen for the swelling in his jaw d/t grinding of his teeth. The pain is bilaterally from his ears down to his jaw.

## 2020-10-22 ENCOUNTER — Other Ambulatory Visit: Payer: Self-pay

## 2020-10-22 ENCOUNTER — Emergency Department (HOSPITAL_BASED_OUTPATIENT_CLINIC_OR_DEPARTMENT_OTHER)
Admission: EM | Admit: 2020-10-22 | Discharge: 2020-10-22 | Disposition: A | Discharge status: Home / Self Care | Payer: 59 | Attending: Emergency Medicine | Admitting: Emergency Medicine

## 2020-10-22 ENCOUNTER — Emergency Department (HOSPITAL_BASED_OUTPATIENT_CLINIC_OR_DEPARTMENT_OTHER): Payer: 59

## 2020-10-22 ENCOUNTER — Encounter (HOSPITAL_BASED_OUTPATIENT_CLINIC_OR_DEPARTMENT_OTHER): Payer: Self-pay | Admitting: Obstetrics and Gynecology

## 2020-10-22 DIAGNOSIS — N50812 Left testicular pain: Secondary | ICD-10-CM

## 2020-10-22 DIAGNOSIS — F17210 Nicotine dependence, cigarettes, uncomplicated: Secondary | ICD-10-CM | POA: Insufficient documentation

## 2020-10-22 DIAGNOSIS — I861 Scrotal varices: Secondary | ICD-10-CM | POA: Diagnosis not present

## 2020-10-22 DIAGNOSIS — F17290 Nicotine dependence, other tobacco product, uncomplicated: Secondary | ICD-10-CM | POA: Insufficient documentation

## 2020-10-22 DIAGNOSIS — R52 Pain, unspecified: Secondary | ICD-10-CM

## 2020-10-22 LAB — URINALYSIS, ROUTINE W REFLEX MICROSCOPIC
Bilirubin Urine: NEGATIVE
Glucose, UA: NEGATIVE mg/dL
Hgb urine dipstick: NEGATIVE
Ketones, ur: NEGATIVE mg/dL
Leukocytes,Ua: NEGATIVE
Nitrite: NEGATIVE
Specific Gravity, Urine: 1.032 — ABNORMAL HIGH (ref 1.005–1.030)
pH: 5.5 (ref 5.0–8.0)

## 2020-10-22 LAB — COMPREHENSIVE METABOLIC PANEL
ALT: 21 U/L (ref 0–44)
AST: 19 U/L (ref 15–41)
Albumin: 4.4 g/dL (ref 3.5–5.0)
Alkaline Phosphatase: 83 U/L (ref 38–126)
Anion gap: 10 (ref 5–15)
BUN: 12 mg/dL (ref 6–20)
CO2: 23 mmol/L (ref 22–32)
Calcium: 9.5 mg/dL (ref 8.9–10.3)
Chloride: 103 mmol/L (ref 98–111)
Creatinine, Ser: 0.75 mg/dL (ref 0.61–1.24)
GFR, Estimated: >60 mL/min (ref >60)
Glucose, Bld: 106 mg/dL — ABNORMAL HIGH (ref 70–99)
Potassium: 3.8 mmol/L (ref 3.5–5.1)
Sodium: 136 mmol/L (ref 135–145)
Total Bilirubin: 0.5 mg/dL (ref 0.3–1.2)
Total Protein: 7.2 g/dL (ref 6.5–8.1)

## 2020-10-22 LAB — CBC
HCT: 39.8 % (ref 39.0–52.0)
Hemoglobin: 13.3 g/dL (ref 13.0–17.0)
MCH: 27.1 pg (ref 26.0–34.0)
MCHC: 33.4 g/dL (ref 30.0–36.0)
MCV: 81.2 fL (ref 80.0–100.0)
Platelets: 362 10*3/uL (ref 150–400)
RBC: 4.9 MIL/uL (ref 4.22–5.81)
RDW: 14 % (ref 11.5–15.5)
WBC: 8.2 10*3/uL (ref 4.0–10.5)
nRBC: 0 % (ref 0.0–0.2)

## 2020-10-22 LAB — LIPASE, BLOOD: Lipase: 15 U/L (ref 11–51)

## 2020-10-22 MED ORDER — ONDANSETRON 4 MG PO TBDP: 4.0000 mg | ORAL_TABLET | Freq: Three times a day (TID) | ORAL | 0 refills | Status: DC | PRN

## 2020-10-22 NOTE — Discharge Instructions (Addendum)
You were seen in the ER today for evaluation of testicular pain.  Your blood work and urine today all was reassuring. We did an ultrasound of your scrotum which showed you have a left sided varicocele which is swelling of the veins in your scrotum. This is a condition usually followed by urology to decide if you would need surgical repair or not. I have attached their contact information to make an appointment.  In the mean time you can take over the counter medication for pain and I've written you a prescription for zofran which is an anti-nausea medicine.

## 2020-10-22 NOTE — ED Triage Notes (Signed)
Patient reports to the ER for left sided flank and groin pain. Patient reports the pain started consistently over the weekend. Patient denies penile d/c. Patient reports feeling nauseated. States testicles are tender to touch and even sitting is painful. Patient reports he is able to pee. Patient reports he is currently sexually active with one partner.

## 2020-10-22 NOTE — ED Provider Notes (Signed)
[facility] EMERGENCY DEPT Provider Note   CSN: [id number] Arrival date & time: 10/22/20  1251     History Chief Complaint  Patient presents with   Flank Pain   Groin Pain    Aaron Wheeler is a 30 y.o. male who presents with left-sided flank and groin pain.  Patient has had similar symptoms for about a year, started worsening over this weekend.  He states that his testicles are tender to touch, and sitting down is painful for him.  He is also felt nauseous, has not vomited.  Denies penile discharge, dysuria, increased urinary frequency or urgency.  He is sexually active with one partner.    Flank Pain Associated symptoms include abdominal pain.  Groin Pain Associated symptoms include abdominal pain.      Past Medical History:  Diagnosis Date   Thyroid disease    hyperthyroidsm    There are no problems to display for this patient.   Past Surgical History:  Procedure Laterality Date   TESTICLE SURGERY         No family history on file.  Social History   Tobacco Use   Smoking status: Light Smoker    Packs/day: 0.25    Types: E-cigarettes, Cigarettes    Passive exposure: Current   Smokeless tobacco: Never  Vaping Use   Vaping Use: Every day   Substances: Nicotine, Flavoring  Substance Use Topics   Alcohol use: No   Drug use: No    Home Medications Prior to Admission medications   Medication Sig Start Date End Date Taking? Authorizing Provider  ondansetron (ZOFRAN ODT) 4 MG disintegrating tablet Take 1 tablet (4 mg total) by mouth every 8 (eight) hours as needed for nausea or vomiting. 10/22/20  Yes Beck Cofer T, PA-C  hydrOXYzine (ATARAX/VISTARIL) 25 MG tablet Take 1 tablet (25 mg total) by mouth every 6 (six) hours as needed for anxiety. 12/11/17   Shaune Pollack, MD  naproxen sodium (ALEVE) 220 MG tablet Take 660 mg by mouth daily as needed (pain).    [provider]    Allergies    Ceclor [cefaclor]  Review of Systems    Review of Systems  Constitutional:  Negative for chills and fever.  Gastrointestinal:  Positive for abdominal pain and nausea. Negative for constipation, diarrhea and vomiting.  Genitourinary:  Positive for flank pain and testicular pain. Negative for dysuria, penile discharge, penile pain and urgency.  All other systems reviewed and are negative.  Physical Exam Updated Vital Signs BP (!) 150/88 (BP Location: Right Arm)   Pulse 72   Temp 98.4 F (36.9 C) (Oral)   Resp 15   Ht 5\' 6"  (1.676 m)   Wt (!) 181.4 kg   SpO2 99%   BMI 64.56 kg/m   Physical Exam Vitals and nursing note reviewed. Exam conducted with a chaperone present.  Constitutional:      Appearance: Normal appearance.  HENT:     Head: Normocephalic and atraumatic.  Eyes:     Conjunctiva/sclera: Conjunctivae normal.  Pulmonary:     Effort: Pulmonary effort is normal. No respiratory distress.  Abdominal:     Hernia: There is no hernia in the left inguinal area or right inguinal area.  Genitourinary:    Penis: Uncircumcised. No tenderness, swelling or lesions.      Testes: Cremasteric reflex is present.        Left: Tenderness and varicocele present.  Skin:    General: Skin is warm and dry.  Neurological:  Mental Status: He is alert.  Psychiatric:        Mood and Affect: Mood normal.        Behavior: Behavior normal.    ED Results / Procedures / Treatments   Labs (all labs ordered are listed, but only abnormal results are displayed) Labs Reviewed  COMPREHENSIVE METABOLIC PANEL - Abnormal; Notable for the following components:      Result Value   Glucose, Bld 106 (*)    All other components within normal limits  URINALYSIS, ROUTINE W REFLEX MICROSCOPIC - Abnormal; Notable for the following components:   Specific Gravity, Urine 1.032 (*)    Protein, ur TRACE (*)    All other components within normal limits  LIPASE, BLOOD  CBC    EKG None  Radiology US SCROTUM W/DOPPLER  Result Date:  10/22/2020 CLINICAL DATA:  Left scrotal pain. 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 EXAM: SCROTAL ULTRASOUND DOPPLER ULTRASOUND OF THE TESTICLES TECHNIQUE: Complete ultrasound examination of the testicles, epididymis, and other scrotal structures was performed. Color and spectral Doppler ultrasound were also utilized to evaluate blood flow to the testicles. COMPARISON:  None. FINDINGS: Right testicle Measurements: 4.7 x 2.0 x 2.7 cm. No mass visualized. Solitary microcalcification considered incidental. Left testicle Measurements: 5.1 x 2.5 x 3.3 cm. No mass or microlithiasis visualized. Right epididymis:  Normal in size and appearance. Left epididymis:  Normal in size and appearance. Hydrocele:  None visualized. Varicocele:  Positive for left-sided varicocele. Pulsed Doppler interrogation of both testes demonstrates normal low resistance arterial and venous waveforms bilaterally. IMPRESSION: 1. No evidence of testicular torsion at this time. 2. Positive for left-sided varicocele. This may represent the source of the patient's left-sided testicular pain. 3. No evidence of testicular mass or significant abnormality. Electronically Signed   By: Malachy Moan M.D.   On: 10/22/2020 16:20    Procedures Procedures   Medications Ordered in ED Medications - No data to display  ED Course  I have reviewed the triage vital signs and the nursing notes.  Pertinent labs & imaging results that were available during my care of the patient were reviewed by me and considered in my medical decision making (see chart for details).    MDM Rules/Calculators/A&P                           Patient is 31 y/o male who presents with left flank and testicular pain, worsened over the weekend. Hx of left inguinal hernia dx on [place] a year ago, patient did not get CT follow up of this.   Concern for epididymitis or testicular torsion. Urinalysis unremarkable for hematuria or infection. CBC, CMP, and  lipase unremarkable. Scrotal US performed which showed left sided varicocele, no evidence of torsion. On exam patient is afebrile and in no acute distress. His abdomen is soft with some tenderness to palpation in LLQ. There is tenderness to palpation of left testicle with some mild swelling. No hernia noted on exam.   Patient's symptoms likely due to varicocele. Discussed results with patient and need for urology follow up. Patient is not requiring admission or inpatient treatment for his symptoms at this time. He is stable to d/c to home with over the counter medication for pain and zofran. Patient agreeable to plan.   Final Clinical Impression(s) / ED Diagnoses Final diagnoses:  Testicular pain, left  Varicocele    Rx / DC Orders ED Discharge Orders  Ordered    ondansetron (ZOFRAN ODT) 4 MG disintegrating tablet  Every 8 hours PRN        10/22/20 1717             Kadiatou Oplinger T, PA-C 10/22/20 1803    Ernie Avena, MD 10/22/20 2335

## 2020-11-02 ENCOUNTER — Ambulatory Visit: Payer: Self-pay | Admitting: Urology

## 2021-12-04 ENCOUNTER — Other Ambulatory Visit: Payer: Self-pay

## 2021-12-04 ENCOUNTER — Encounter (HOSPITAL_BASED_OUTPATIENT_CLINIC_OR_DEPARTMENT_OTHER): Payer: Self-pay

## 2021-12-04 ENCOUNTER — Emergency Department (HOSPITAL_BASED_OUTPATIENT_CLINIC_OR_DEPARTMENT_OTHER)
Admission: EM | Admit: 2021-12-04 | Discharge: 2021-12-04 | Disposition: A | Discharge status: Home / Self Care | Payer: 59 | Attending: Emergency Medicine | Admitting: Emergency Medicine

## 2021-12-04 DIAGNOSIS — R197 Diarrhea, unspecified: Secondary | ICD-10-CM | POA: Insufficient documentation

## 2021-12-04 DIAGNOSIS — R112 Nausea with vomiting, unspecified: Secondary | ICD-10-CM | POA: Insufficient documentation

## 2021-12-04 LAB — LIPASE, BLOOD: Lipase: 12 U/L (ref 11–51)

## 2021-12-04 LAB — URINALYSIS, ROUTINE W REFLEX MICROSCOPIC
Bilirubin Urine: NEGATIVE
Glucose, UA: NEGATIVE mg/dL
Hgb urine dipstick: NEGATIVE
Ketones, ur: NEGATIVE mg/dL
Leukocytes,Ua: NEGATIVE
Nitrite: NEGATIVE
Protein, ur: NEGATIVE mg/dL
Specific Gravity, Urine: 1.03 (ref 1.005–1.030)
pH: 5.5 (ref 5.0–8.0)

## 2021-12-04 LAB — CBC
HCT: 43.4 % (ref 39.0–52.0)
Hemoglobin: 14.4 g/dL (ref 13.0–17.0)
MCH: 26.9 pg (ref 26.0–34.0)
MCHC: 33.2 g/dL (ref 30.0–36.0)
MCV: 81 fL (ref 80.0–100.0)
Platelets: 332 10*3/uL (ref 150–400)
RBC: 5.36 MIL/uL (ref 4.22–5.81)
RDW: 14.7 % (ref 11.5–15.5)
WBC: 8.8 10*3/uL (ref 4.0–10.5)
nRBC: 0 % (ref 0.0–0.2)

## 2021-12-04 LAB — COMPREHENSIVE METABOLIC PANEL
ALT: 25 U/L (ref 0–44)
AST: 16 U/L (ref 15–41)
Albumin: 4.5 g/dL (ref 3.5–5.0)
Alkaline Phosphatase: 101 U/L (ref 38–126)
Anion gap: 9 (ref 5–15)
BUN: 11 mg/dL (ref 6–20)
CO2: 24 mmol/L (ref 22–32)
Calcium: 9.3 mg/dL (ref 8.9–10.3)
Chloride: 104 mmol/L (ref 98–111)
Creatinine, Ser: 0.85 mg/dL (ref 0.61–1.24)
GFR, Estimated: >60 mL/min (ref >60)
Glucose, Bld: 105 mg/dL — ABNORMAL HIGH (ref 70–99)
Potassium: 3.9 mmol/L (ref 3.5–5.1)
Sodium: 137 mmol/L (ref 135–145)
Total Bilirubin: 0.6 mg/dL (ref 0.3–1.2)
Total Protein: 7.5 g/dL (ref 6.5–8.1)

## 2021-12-04 MED ORDER — SODIUM CHLORIDE 0.9 % IV BOLUS
1000.0000 mL | Freq: Once | INTRAVENOUS | Status: AC
  Administered 2021-12-04: 1000 mL via INTRAVENOUS

## 2021-12-04 MED ORDER — ONDANSETRON 4 MG PO TBDP: 4.0000 mg | ORAL_TABLET | Freq: Three times a day (TID) | ORAL | 0 refills | Status: DC | PRN

## 2021-12-04 MED ORDER — ONDANSETRON 4 MG PO TBDP
4.0000 mg | ORAL_TABLET | Freq: Once | ORAL | Status: AC
  Administered 2021-12-04: 4 mg via ORAL
  Filled 2021-12-04: qty 1

## 2021-12-04 NOTE — ED Provider Notes (Signed)
[facility] EMERGENCY DEPT Provider Note   CSN: [id number] Arrival date & time: 12/04/21  1403     History  Chief Complaint  Patient presents with   Diarrhea    Aaron Wheeler is a 31 y.o. male.  Patient with no past surgical history presents to the emergency department today for evaluation of 1 day of nausea, vomiting, and diarrhea.  Symptoms started together yesterday afternoon.  Patient reports decreased appetite.  He has been able to keep down some liquids today.  Diarrhea nonbloody.  No fevers but has had some chills.  No focal abdominal pain.  No suspicious food or water exposures but did eat some leftovers prior to the symptoms beginning.  No other sick contacts.  No respiratory symptoms.      Home Medications Prior to Admission medications   Medication Sig Start Date End Date Taking? Authorizing Provider  hydrOXYzine (ATARAX/VISTARIL) 25 MG tablet Take 1 tablet (25 mg total) by mouth every 6 (six) hours as needed for anxiety. 12/11/17   Duffy Bruce, MD  naproxen sodium (ALEVE) 220 MG tablet Take 660 mg by mouth daily as needed (pain).    [provider]  ondansetron (ZOFRAN ODT) 4 MG disintegrating tablet Take 1 tablet (4 mg total) by mouth every 8 (eight) hours as needed for nausea or vomiting. 10/22/20   Roemhildt, Lorin T, PA-C      Allergies    Ceclor [cefaclor] and Tylenol [acetaminophen]    Review of Systems   Review of Systems  Physical Exam Updated Vital Signs BP (!) 161/104 (BP Location: Right Arm)   Pulse (!) 102   Temp 98 F (36.7 C) (Oral)   Resp 20   Ht 5\' 6"  (1.676 m)   Wt (!) 181.4 kg   SpO2 100%   BMI 64.55 kg/m  Physical Exam Vitals and nursing note reviewed.  Constitutional:      General: He is not in acute distress.    Appearance: He is well-developed.  HENT:     Head: Normocephalic and atraumatic.     Nose: Nose normal.  Eyes:     General:        Right eye: No discharge.        Left eye: No discharge.      Conjunctiva/sclera: Conjunctivae normal.  Cardiovascular:     Rate and Rhythm: Normal rate and regular rhythm.     Heart sounds: Normal heart sounds.  Pulmonary:     Effort: Pulmonary effort is normal.     Breath sounds: Normal breath sounds.  Abdominal:     Palpations: Abdomen is soft.     Tenderness: There is no abdominal tenderness. There is no guarding or rebound.  Musculoskeletal:     Cervical back: Normal range of motion and neck supple.  Skin:    General: Skin is warm and dry.  Neurological:     Mental Status: He is alert.    ED Results / Procedures / Treatments   Labs (all labs ordered are listed, but only abnormal results are displayed) Labs Reviewed  COMPREHENSIVE METABOLIC PANEL - Abnormal; Notable for the following components:      Result Value   Glucose, Bld 105 (*)    All other components within normal limits  LIPASE, BLOOD  CBC  URINALYSIS, ROUTINE W REFLEX MICROSCOPIC    EKG None  Radiology No results found.  Procedures Procedures    Medications Ordered in ED Medications  ondansetron (ZOFRAN-ODT) disintegrating tablet 4 mg (4 mg Oral  Given 12/04/21 1526)  sodium chloride 0.9 % bolus 1,000 mL (1,000 mLs Intravenous New Bag/Given 12/04/21 1634)    ED Course/ Medical Decision Making/ A&P    Patient seen and examined. History obtained directly from patient.   Labs/EKG: Ordered CBC, CMP, lipase, UA.  Imaging: None ordered  Medications/Fluids: ODT Zofran  Most recent vital signs reviewed and are as follows: BP (!) 161/104 (BP Location: Right Arm)   Pulse (!) 102   Temp 98 F (36.7 C) (Oral)   Resp 20   Ht 5\' 6"  (1.676 m)   Wt (!) 181.4 kg   SpO2 100%   BMI 64.55 kg/m   Initial impression: Gastroenteritis, abdominal exam is reassuring without focal tenderness  3:22 PM   Labs personally reviewed and interpreted including: CBC unremarkable; CMP glucose minimally elevated at 105 otherwise unremarkable; lipase normal.  Plan: Fluid  challenge, reassess  4:17 PM Reassessment performed. Patient appears comfortable.  Patient sipped on a small bit of fluid but had increasing pain.  No vomiting.  Exam unchanged.  Reviewed pertinent lab work and imaging with patient at bedside. Questions answered.   Most current vital signs reviewed and are as follows: BP (!) 143/90 (BP Location: Left Arm)   Pulse 91   Temp 98 F (36.7 C) (Oral)   Resp 20   Ht 5\' 6"  (1.676 m)   Wt (!) 181.4 kg   SpO2 99%   BMI 64.55 kg/m   Plan: Prior to discharge, will give fluid bolus.  If vomiting continues to be controlled at that point, we will discharge to home with nausea medication.  We did discuss clear liquid diet, slow advancement to brat diet.  5:25 PM Fluid bolus almost done. Plan to d/c.   Prescriptions written for: zofran  ED return instructions discussed: The patient was urged to return to the Emergency Department immediately with worsening of current symptoms, worsening abdominal pain, persistent vomiting, blood noted in stools, fever, or any other concerns. The patient verbalized understanding.   Follow-up instructions discussed: Patient encouraged to follow-up with their PCP in 3 days.                            Medical Decision Making Amount and/or Complexity of Data Reviewed Labs: ordered.  Risk Prescription drug management.   For this patient's complaint of N/V/D, abdominal pain, the following conditions were considered on the differential diagnosis: gastritis/PUD, enteritis/duodenitis, appendicitis, cholelithiasis/cholecystitis, cholangitis, pancreatitis, ruptured viscus, colitis, diverticulitis, small/large bowel obstruction, proctitis, cystitis, pyelonephritis, ureteral colic, aortic dissection, aortic aneurysm. Atypical chest etiologies were also considered including ACS, PE, and pneumonia.   The patient's vital signs, pertinent lab work and imaging were reviewed and interpreted as discussed in the ED course.  Hospitalization was considered for further testing, treatments, or serial exams/observation. However as patient is well-appearing, has a stable exam, and reassuring studies today, I do not feel that they warrant admission at this time. This plan was discussed with the patient who verbalizes agreement and comfort with this plan and seems reliable and able to return to the Emergency Department with worsening or changing symptoms.          Final Clinical Impression(s) / ED Diagnoses Final diagnoses:  Nausea vomiting and diarrhea    Rx / DC Orders ED Discharge Orders          Ordered    ondansetron (ZOFRAN-ODT) 4 MG disintegrating tablet  Every 8 hours PRN  12/04/21 1724              Carlisle Cater, PA-C 12/04/21 1726    Jeanell Sparrow, DO 12/06/21 1003

## 2021-12-04 NOTE — Discharge Instructions (Signed)
Please read and follow all provided instructions.  Your diagnoses today include:  1. Nausea vomiting and diarrhea     TTests performed today include: Blood cell counts and platelets Kidney and liver function tests Pancreas function test (called lipase) Urine test to look for infection Vital signs. See below for your results today.   Medications prescribed:  Zofran (ondansetron) - for nausea and vomiting  Take any prescribed medications only as directed.  Home care instructions:  Follow any educational materials contained in this packet.  Your abdominal pain, nausea, vomiting, and diarrhea may be caused by a viral gastroenteritis also called 'stomach flu'. You should rest for the next several days. Keep drinking plenty of fluids and use the medicine for nausea as directed.   Drink clear liquids for the next 24 hours and introduce solid foods slowly after 24 hours using the b.r.a.t. diet (Bananas, Rice, Applesauce, Toast, Yogurt).    Follow-up instructions: Please follow-up with your primary care provider in the next 2 days for further evaluation of your symptoms. If you are not feeling better in 48 hours you may have a condition that is more serious and you need re-evaluation.   Return instructions:  SEEK IMMEDIATE MEDICAL ATTENTION IF: If you have pain that does not go away or becomes severe  A temperature above 101F develops  Repeated vomiting occurs (multiple episodes)  If you have pain that becomes localized to portions of the abdomen. The right side could possibly be appendicitis. In an adult, the left lower portion of the abdomen could be colitis or diverticulitis.  Blood is being passed in stools or vomit (bright red or black tarry stools)  You develop chest pain, difficulty breathing, dizziness or fainting, or become confused, poorly responsive, or inconsolable (young children) If you have any other emergent concerns regarding your health  Additional Information: Abdominal  (belly) pain can be caused by many things. Your caregiver performed an examination and possibly ordered blood/urine tests and imaging (CT scan, x-rays, ultrasound). Many cases can be observed and treated at home after initial evaluation in the emergency department. Even though you are being discharged home, abdominal pain can be unpredictable. Therefore, you need a repeated exam if your pain does not resolve, returns, or worsens. Most patients with abdominal pain don't have to be admitted to the hospital or have surgery, but serious problems like appendicitis and gallbladder attacks can start out as nonspecific pain. Many abdominal conditions cannot be diagnosed in one visit, so follow-up evaluations are very important.  Your vital signs today were: BP 128/82 (BP Location: Right Arm)   Pulse 83   Temp 98 F (36.7 C) (Oral)   Resp 20   Ht 5\' 6"  (1.676 m)   Wt (!) 181.4 kg   SpO2 99%   BMI 64.55 kg/m  If your blood pressure (bp) was elevated above 135/85 this visit, please have this repeated by your doctor within one month. --------------

## 2021-12-04 NOTE — ED Triage Notes (Signed)
Patient here POV from Home.  Endorses N/V/D with ABD Cramping since Yesterday AM.   No Known fevers.   NAD Noted during Triage. A&Ox4. GCS 15. Ambulatory.

## 2021-12-04 NOTE — ED Notes (Signed)
Pt was able to drink and keep down some Gatorade. Pt stated his stomach got a little upset feeling so he stopped drinking it for now, but Pt did not vomit.

## 2022-02-20 ENCOUNTER — Emergency Department (HOSPITAL_BASED_OUTPATIENT_CLINIC_OR_DEPARTMENT_OTHER)
Admission: EM | Admit: 2022-02-20 | Discharge: 2022-02-20 | Disposition: A | Discharge status: Home / Self Care | Payer: 59 | Attending: Emergency Medicine | Admitting: Emergency Medicine

## 2022-02-20 ENCOUNTER — Other Ambulatory Visit: Payer: Self-pay

## 2022-02-20 ENCOUNTER — Emergency Department (HOSPITAL_BASED_OUTPATIENT_CLINIC_OR_DEPARTMENT_OTHER): Payer: 59 | Admitting: Radiology

## 2022-02-20 ENCOUNTER — Encounter (HOSPITAL_BASED_OUTPATIENT_CLINIC_OR_DEPARTMENT_OTHER): Payer: Self-pay

## 2022-02-20 ENCOUNTER — Emergency Department (HOSPITAL_BASED_OUTPATIENT_CLINIC_OR_DEPARTMENT_OTHER): Payer: 59

## 2022-02-20 DIAGNOSIS — R059 Cough, unspecified: Secondary | ICD-10-CM | POA: Diagnosis not present

## 2022-02-20 DIAGNOSIS — J45909 Unspecified asthma, uncomplicated: Secondary | ICD-10-CM | POA: Insufficient documentation

## 2022-02-20 DIAGNOSIS — R0981 Nasal congestion: Secondary | ICD-10-CM | POA: Diagnosis not present

## 2022-02-20 DIAGNOSIS — Z7951 Long term (current) use of inhaled steroids: Secondary | ICD-10-CM | POA: Diagnosis not present

## 2022-02-20 DIAGNOSIS — Z1152 Encounter for screening for COVID-19: Secondary | ICD-10-CM | POA: Diagnosis not present

## 2022-02-20 LAB — RESP PANEL BY RT-PCR (RSV, FLU A&B, COVID)  RVPGX2
Influenza A by PCR: NEGATIVE
Influenza B by PCR: NEGATIVE
Resp Syncytial Virus by PCR: NEGATIVE
SARS Coronavirus 2 by RT PCR: NEGATIVE

## 2022-02-20 LAB — COMPREHENSIVE METABOLIC PANEL
ALT: 24 U/L (ref 0–44)
AST: 17 U/L (ref 15–41)
Albumin: 4.7 g/dL (ref 3.5–5.0)
Alkaline Phosphatase: 86 U/L (ref 38–126)
Anion gap: 11 (ref 5–15)
BUN: 14 mg/dL (ref 6–20)
CO2: 25 mmol/L (ref 22–32)
Calcium: 9.9 mg/dL (ref 8.9–10.3)
Chloride: 103 mmol/L (ref 98–111)
Creatinine, Ser: 0.83 mg/dL (ref 0.61–1.24)
GFR, Estimated: >60 mL/min (ref >60)
Glucose, Bld: 91 mg/dL (ref 70–99)
Potassium: 3.8 mmol/L (ref 3.5–5.1)
Sodium: 139 mmol/L (ref 135–145)
Total Bilirubin: 0.6 mg/dL (ref 0.3–1.2)
Total Protein: 7.6 g/dL (ref 6.5–8.1)

## 2022-02-20 MED ORDER — ALBUTEROL SULFATE HFA 108 (90 BASE) MCG/ACT IN AERS: 1.0000 | INHALATION_SPRAY | Freq: Four times a day (QID) | RESPIRATORY_TRACT | 0 refills | Status: DC | PRN

## 2022-02-20 MED ORDER — IOHEXOL 300 MG/ML  SOLN
100.0000 mL | Freq: Once | INTRAMUSCULAR | Status: AC | PRN
  Administered 2022-02-20: 100 mL via INTRAVENOUS

## 2022-02-20 NOTE — ED Triage Notes (Signed)
Patient here POV from Home.  Endorses Somewhat Productive Cough for 2 Weeks. Mostly Consistent for approximately 1 Week.  No Fevers. No Aches. No Sore Throat.   NAD Noted during Triage. A&Ox4. GCS 15. Ambulatory.

## 2022-02-20 NOTE — ED Notes (Signed)
Patient transported to XR. 

## 2022-02-20 NOTE — ED Provider Notes (Signed)
[facility] EMERGENCY DEPT Provider Note   CSN: [id number] Arrival date & time: 02/20/22  1807     History  Chief Complaint  Patient presents with   Cough    Aaron Wheeler is a 32 y.o. male with PMH significant for childhood asthma, allergic rhinitis and obesity who presents with cough x2 weeks.  Patient states he "hasn't really felt bad at all", but has had this persistent cough that "feels deep in his chest". Endorses associated nasal congestion and post-nasal drip. Coughed up blood-tinged sputum last night which is what prompted him to seek care. No fever, sore throat, sinus pressure, chest pain, or shortness of breath. No GI symptoms. No leg pain or swelling. No sick contacts that he's aware of. Patient vapes daily but does not smoke cigarettes.     Home Medications Prior to Admission medications   Medication Sig Start Date End Date Taking? Authorizing Provider  albuterol (VENTOLIN HFA) 108 (90 Base) MCG/ACT inhaler Inhale 1-2 puffs into the lungs every 6 (six) hours as needed for wheezing or shortness of breath. 02/20/22  Yes Maury Dus, MD  hydrOXYzine (ATARAX/VISTARIL) 25 MG tablet Take 1 tablet (25 mg total) by mouth every 6 (six) hours as needed for anxiety. 12/11/17   Shaune Pollack, MD  naproxen sodium (ALEVE) 220 MG tablet Take 660 mg by mouth daily as needed (pain).    [provider]  ondansetron (ZOFRAN-ODT) 4 MG disintegrating tablet Take 1 tablet (4 mg total) by mouth every 8 (eight) hours as needed for nausea or vomiting. 12/04/21   Renne Crigler, PA-C      Allergies    Ceclor [cefaclor] and Tylenol [acetaminophen]    Review of Systems   Review of Systems  Constitutional:  Negative for chills and fever.  HENT:  Positive for congestion and postnasal drip. Negative for sore throat.   Respiratory:  Positive for cough. Negative for shortness of breath.   Cardiovascular:  Negative for chest pain and leg swelling.  Gastrointestinal:   Negative for abdominal pain, diarrhea and vomiting.  Skin:  Negative for rash.  Neurological:  Negative for headaches.    Physical Exam Updated Vital Signs BP 130/85 (BP Location: Right Arm)   Pulse 78   Temp 98.2 F (36.8 C) (Oral)   Resp 18   Ht 5\' 6"  (1.676 m)   Wt (!) 181.4 kg   SpO2 100%   BMI 64.55 kg/m  Physical Exam Constitutional:      General: He is not in acute distress.    Appearance: He is obese. He is not toxic-appearing.  HENT:     Head: Normocephalic and atraumatic.     Nose: Congestion present.     Mouth/Throat:     Mouth: Mucous membranes are moist.     Pharynx: Oropharynx is clear. No oropharyngeal exudate or posterior oropharyngeal erythema.  Eyes:     Conjunctiva/sclera: Conjunctivae normal.  Cardiovascular:     Rate and Rhythm: Normal rate and regular rhythm.     Heart sounds: Normal heart sounds.  Pulmonary:     Effort: Pulmonary effort is normal. No respiratory distress.     Breath sounds: Normal breath sounds. No wheezing or rales.  Musculoskeletal:     Cervical back: Neck supple.     Right lower leg: No edema.     Left lower leg: No edema.  Skin:    Capillary Refill: Capillary refill takes less than 2 seconds.  Neurological:     General: No focal deficit present.  Mental Status: He is alert. Mental status is at baseline.     ED Results / Procedures / Treatments   Labs (all labs ordered are listed, but only abnormal results are displayed) Labs Reviewed  RESP PANEL BY RT-PCR (RSV, FLU A&B, COVID)  [id number]  COMPREHENSIVE METABOLIC PANEL    EKG None  Radiology CT Chest W Contrast  Result Date: 02/20/2022 CLINICAL DATA:  Abnormal xray - lung nodule, >= 1 cm EXAM: CT CHEST WITH CONTRAST TECHNIQUE: Multidetector CT imaging of the chest was performed during intravenous contrast administration. RADIATION DOSE REDUCTION: This exam was performed according to the departmental dose-optimization program which includes automated exposure  control, adjustment of the mA and/or kV according to patient size and/or use of iterative reconstruction technique. CONTRAST:  14mL OMNIPAQUE IOHEXOL 300 MG/ML  SOLN COMPARISON:  Chest x-ray 02/20/2022, chest x-ray 11 8 19  FINDINGS: Cardiovascular: Normal heart size. No significant pericardial effusion. The thoracic aorta is normal in caliber. No atherosclerotic plaque of the thoracic aorta. No coronary artery calcifications. Mediastinum/Nodes: Nonspecific borderline enlarged right hilar lymph node measuring up to 1 cm (2:58). No left hilar lymphadenopathy. No enlarged mediastinal, hilar, or axillary lymph nodes. Thyroid gland, trachea, and esophagus demonstrate no significant findings. Lungs/Pleura: No focal consolidation. No pulmonary nodule. No pulmonary mass. No pleural effusion. No pneumothorax. Upper Abdomen: No acute abnormality. Musculoskeletal: No chest wall abnormality. No suspicious lytic or blastic osseous lesions. No acute displaced fracture. IMPRESSION: No correlation with chest x-ray finding 02/20/2022. Electronically Signed   By: Iven Finn M.D.   On: 02/20/2022 22:22   DG Chest 2 View  Result Date: 02/20/2022 CLINICAL DATA:  Hemoptysis.  Productive cough for 2 weeks. EXAM: CHEST - 2 VIEW COMPARISON:  12/11/2017 FINDINGS: There is a 4.3 cm diameter nodular opacity in the right infrahilar region, not seen previously. This could represent focal infiltration or developing mass lesion. Suggest CT for further evaluation. Left lung is clear. No pleural effusions. No pneumothorax. Mediastinal contours appear intact. Normal heart size and pulmonary vascularity. IMPRESSION: Developing 4.3 cm nodular opacity in the right infrahilar region. Suggest CT for further evaluation. Electronically Signed   By: Lucienne Capers M.D.   On: 02/20/2022 19:37    Procedures Procedures    Medications Ordered in ED Medications  iohexol (OMNIPAQUE) 300 MG/ML solution 100 mL (100 mLs Intravenous Contrast Given  02/20/22 2209)    ED Course/ Medical Decision Making/ A&P                             Medical Decision Making This is a 32 year old male with history of childhood asthma, allergic rhinitis, and obesity presenting with cough x2 weeks and associated nasal congestion.  Had episode of hemoptysis yesterday which prompted him to come in.  No fevers, dyspnea, chest pain, or other complaints. Physical exam unremarkable including normal lung exam. Ddx includes viral URI, post-nasal drip, asthma exacerbation, CAP, bronchitis, sinusitis, less likely TB or malignancy. Will check viral quad screen and CXR.  CXR reveals 4.3cm nodular opacity in right infrahilar region. Patient informed of results at bedside and CT chest ordered for further evaluation.  CT chest without abnormality. Images independently reviewed. No findings to correlate with nodular opacity that was seen on CXR. Patient stable for discharge home with suspected viral URI. Rx sent for albuterol inhaler given his prior history of asthma. Advised continued supportive care and PCP follow up.  Amount and/or Complexity of Data Reviewed Labs: ordered.  Radiology: ordered.  Risk Prescription drug management.   Final Clinical Impression(s) / ED Diagnoses Final diagnoses:  Cough, unspecified type    Rx / DC Orders ED Discharge Orders          Ordered    albuterol (VENTOLIN HFA) 108 (90 Base) MCG/ACT inhaler  Every 6 hours PRN        02/20/22 2231              Alcus Dad, MD 02/20/22 2249    Blanchie Dessert, MD 02/26/22 [phone]

## 2022-02-20 NOTE — Discharge Instructions (Addendum)
You were seen today for cough.  You tested negative for COVID, flu, and RSV.  You had a CT scan of your chest and lungs which was normal.  Your symptoms should start to improve over the next week although it can take several more weeks to resolve completely.  You can try honey to help with your symptoms.  I have also sent an albuterol inhaler to your pharmacy.  Please follow-up with your primary care doctor.

## 2022-02-25 ENCOUNTER — Institutional Professional Consult (permissible substitution): Payer: 59 | Admitting: Pulmonary Disease

## 2022-02-26 ENCOUNTER — Encounter: Payer: Self-pay | Admitting: Pulmonary Disease

## 2022-02-26 ENCOUNTER — Ambulatory Visit: Payer: 59 | Admitting: Pulmonary Disease

## 2022-02-26 VITALS — BP 130/90 | HR 104 | Ht 67.0 in | Wt >= 6400 oz

## 2022-02-26 DIAGNOSIS — U070 Vaping-related disorder: Secondary | ICD-10-CM | POA: Diagnosis not present

## 2022-02-26 DIAGNOSIS — J40 Bronchitis, not specified as acute or chronic: Secondary | ICD-10-CM | POA: Diagnosis not present

## 2022-02-26 DIAGNOSIS — R599 Enlarged lymph nodes, unspecified: Secondary | ICD-10-CM | POA: Diagnosis not present

## 2022-02-26 MED ORDER — PREDNISONE 10 MG PO TABS: ORAL_TABLET | ORAL | 0 refills | Status: DC

## 2022-02-26 NOTE — Progress Notes (Signed)
Synopsis: Referred in January 2024 for pulmonary nodule by Kristen Loader, FNP  Subjective:   PATIENT ID: Aaron Wheeler GENDER: male DOB: August 03, 1990, MRN: [id number]  Chief Complaint  Patient presents with   Consult    Cough, chest tightness, lung nodule.    This is a 32 year old gentleman history of hyperthyroidism.  Current every day smoker.  Had CT imaging of the chest which showed a incidental pulmonary nodule.CT imaging of the chest was completed on 02/20/2022.  Patient was found to have a nonspecific enlarged right hilar lymph node measuring 1 cm in size.  No other abnormalities found.  Patient works at a plasma center.  He vapes daily.  Usually has 15 to 20 minutes to vape during the day.  And usually does most of his vaping at nighttime or on the weekends.  Several weeks ago developed chest tightness cough shortness of breath.  Sounds like he had bronchitis symptoms.  Continue to vape on top of this.  Had chest pains.  Chest x-ray revealed a perihilar nodule and he ultimately had a CT scan of the chest which revealed only a small 1 cm node.  We reviewed his imaging today in the office which is likely reactive.     Past Medical History:  Diagnosis Date   Thyroid disease    hyperthyroidsm     No family history on file.   Past Surgical History:  Procedure Laterality Date   TESTICLE SURGERY      Social History   Socioeconomic History   Marital status: Married    Spouse name: Not on file   Number of children: Not on file   Years of education: Not on file   Highest education level: Not on file  Occupational History   Not on file  Tobacco Use   Smoking status: Light Smoker    Packs/day: 0.25    Types: E-cigarettes, Cigarettes    Passive exposure: Current   Smokeless tobacco: Never  Vaping Use   Vaping Use: Every day   Substances: Nicotine, Flavoring  Substance and Sexual Activity   Alcohol use: No   Drug use: No   Sexual activity: Not Currently  Other Topics  Concern   Not on file  Social History Narrative   Not on file   Social Determinants of Health   Financial Resource Strain: Not on file  Food Insecurity: Not on file  Transportation Needs: Not on file  Physical Activity: Not on file  Stress: Not on file  Social Connections: Not on file  Intimate Partner Violence: Not on file     Allergies  Allergen Reactions   Ceclor [Cefaclor] Hives and Itching   Tylenol [Acetaminophen] Swelling     Outpatient Medications Prior to Visit  Medication Sig Dispense Refill   albuterol (VENTOLIN HFA) 108 (90 Base) MCG/ACT inhaler Inhale 1-2 puffs into the lungs every 6 (six) hours as needed for wheezing or shortness of breath. 18 g 0   hydrOXYzine (ATARAX/VISTARIL) 25 MG tablet Take 1 tablet (25 mg total) by mouth every 6 (six) hours as needed for anxiety. 20 tablet 0   naproxen sodium (ALEVE) 220 MG tablet Take 660 mg by mouth daily as needed (pain).     ondansetron (ZOFRAN-ODT) 4 MG disintegrating tablet Take 1 tablet (4 mg total) by mouth every 8 (eight) hours as needed for nausea or vomiting. 10 tablet 0   No facility-administered medications prior to visit.    Review of Systems  Constitutional:  Negative for  chills, fever, malaise/fatigue and weight loss.  HENT:  Negative for hearing loss, sore throat and tinnitus.   Eyes:  Negative for blurred vision and double vision.  Respiratory:  Positive for cough, shortness of breath and wheezing. Negative for hemoptysis, sputum production and stridor.   Cardiovascular:  Negative for chest pain, palpitations, orthopnea, leg swelling and PND.  Gastrointestinal:  Negative for abdominal pain, constipation, diarrhea, heartburn, nausea and vomiting.  Genitourinary:  Negative for dysuria, hematuria and urgency.  Musculoskeletal:  Negative for joint pain and myalgias.  Skin:  Negative for itching and rash.  Neurological:  Negative for dizziness, tingling, weakness and headaches.  Endo/Heme/Allergies:   Negative for environmental allergies. Does not bruise/bleed easily.  Psychiatric/Behavioral:  Negative for depression. The patient is not nervous/anxious and does not have insomnia.   All other systems reviewed and are negative.    Objective:  Physical Exam Vitals reviewed.  Constitutional:      General: He is not in acute distress.    Appearance: He is well-developed. He is obese.  HENT:     Head: Normocephalic and atraumatic.  Eyes:     General: No scleral icterus.    Conjunctiva/sclera: Conjunctivae normal.     Pupils: Pupils are equal, round, and reactive to light.  Neck:     Vascular: No JVD.     Trachea: No tracheal deviation.  Cardiovascular:     Rate and Rhythm: Normal rate and regular rhythm.     Heart sounds: Normal heart sounds. No murmur heard. Pulmonary:     Effort: Pulmonary effort is normal. No tachypnea, accessory muscle usage or respiratory distress.     Breath sounds: No stridor. No wheezing, rhonchi or rales.  Abdominal:     General: There is no distension.     Palpations: Abdomen is soft.     Tenderness: There is no abdominal tenderness.  Musculoskeletal:        General: No tenderness.     Cervical back: Neck supple.  Lymphadenopathy:     Cervical: No cervical adenopathy.  Skin:    General: Skin is warm and dry.     Capillary Refill: Capillary refill takes less than 2 seconds.     Findings: No rash.  Neurological:     Mental Status: He is alert and oriented to person, place, and time.  Psychiatric:        Behavior: Behavior normal.      Vitals:   02/26/22 0901  BP: (!) 130/90  Pulse: (!) 104  SpO2: 98%  Weight: (!) 410 lb 9.6 oz (186.2 kg)  Height: 5\' 7"  (1.702 m)   98% on RA BMI Readings from Last 3 Encounters:  02/26/22 64.31 kg/m  02/20/22 64.55 kg/m  12/04/21 64.55 kg/m   Wt Readings from Last 3 Encounters:  02/26/22 (!) 410 lb 9.6 oz (186.2 kg)  02/20/22 (!) 399 lb 14.6 oz (181.4 kg)  12/04/21 (!) 399 lb 14.6 oz (181.4 kg)      CBC    Component Value Date/Time   WBC 8.8 12/04/2021 1425   RBC 5.36 12/04/2021 1425   HGB 14.4 12/04/2021 1425   HCT 43.4 12/04/2021 1425   PLT 332 12/04/2021 1425   MCV 81.0 12/04/2021 1425   MCH 26.9 12/04/2021 1425   MCHC 33.2 12/04/2021 1425   RDW 14.7 12/04/2021 1425   LYMPHSABS 2.2 11/07/2017 0650   MONOABS 0.5 11/07/2017 0650   EOSABS 0.1 11/07/2017 0650   BASOSABS 0.1 11/07/2017 0650  Chest Imaging:  CT chest without contrast 02/20/2022: Small 1 cm right hilar lymph node.  No perihilar nodules or mass or infiltrate. The patient's images have been independently reviewed by me.    Pulmonary Functions Testing Results:     No data to display          FeNO:   Pathology:   Echocardiogram:   Heart Catheterization:     Assessment & Plan:     ICD-10-CM   1. 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 Bronchitis  J40       Discussion:  This is a 32 year old gentleman, morbidly obese, daily vaping.  Sounds like he had a URI a couple of weeks ago continues to vape on top of this with persistent bronchitis symptoms now.  Which led to imaging of the chest and CT imaging which revealed a small right perihilar lymph node.  I suspect this lymph node is reactive related to his bronchitis symptoms.  Plan: Repeat noncontrast CT in 6 months. Follow-up with primary care Short course of prednisone to see if this helps with his bronchitis symptoms. Continue albuterol as needed. Patient was counseled heavily on vaping cessation.  I explained that his bronchitis symptoms will likely continue if he continues to vape.   Current Outpatient Medications:    albuterol (VENTOLIN HFA) 108 (90 Base) MCG/ACT inhaler, Inhale 1-2 puffs into the lungs every 6 (six) hours as needed for wheezing or shortness of breath., Disp: 18 g, Rfl: 0   predniSONE (DELTASONE) 10 MG tablet, Take 4 tabs by mouth once daily x4 days, then 3 tabs x4  days, 2 tabs x4 days, 1 tab x4 days and stop., Disp: 40 tablet, Rfl: 0   hydrOXYzine (ATARAX/VISTARIL) 25 MG tablet, Take 1 tablet (25 mg total) by mouth every 6 (six) hours as needed for anxiety., Disp: 20 tablet, Rfl: 0   naproxen sodium (ALEVE) 220 MG tablet, Take 660 mg by mouth daily as needed (pain)., Disp: , Rfl:    ondansetron (ZOFRAN-ODT) 4 MG disintegrating tablet, Take 1 tablet (4 mg total) by mouth every 8 (eight) hours as needed for nausea or vomiting., Disp: 10 tablet, Rfl: 0   Josephine Igo, DO [facility] 02/26/2022 9:20 AM

## 2022-02-26 NOTE — Patient Instructions (Signed)
Thank you for visiting Dr. Valeta Harms at [facility]. Today we recommend the following:  Orders Placed This Encounter  Procedures   CT Chest Wo Contrast   Meds ordered this encounter  Medications   predniSONE (DELTASONE) 10 MG tablet    Sig: Take 4 tabs by mouth once daily x4 days, then 3 tabs x4 days, 2 tabs x4 days, 1 tab x4 days and stop.    Dispense:  40 tablet    Refill:  0   Continue albuterol as needed  Stop vaping   Return in about 6 months (around 08/27/2022) for with APP.    Please do your part to reduce the spread of COVID-19.

## 2022-03-15 ENCOUNTER — Other Ambulatory Visit: Payer: Self-pay | Admitting: Family Medicine

## 2022-03-20 IMAGING — US US ABDOMEN LIMITED
1 series · 10 of 10 positions shown · non-contrast
Comparison: None.

CLINICAL DATA: Left lower abdominal pain and tenderness

EXAM:
ULTRASOUND LEFT LOWER QUADRANT/LEFT INGUINAL REGION

[Series 1: us abdomen limited · 0.13mm/px · 10 acquisitions, 10 frames shown]
[im 1/10]
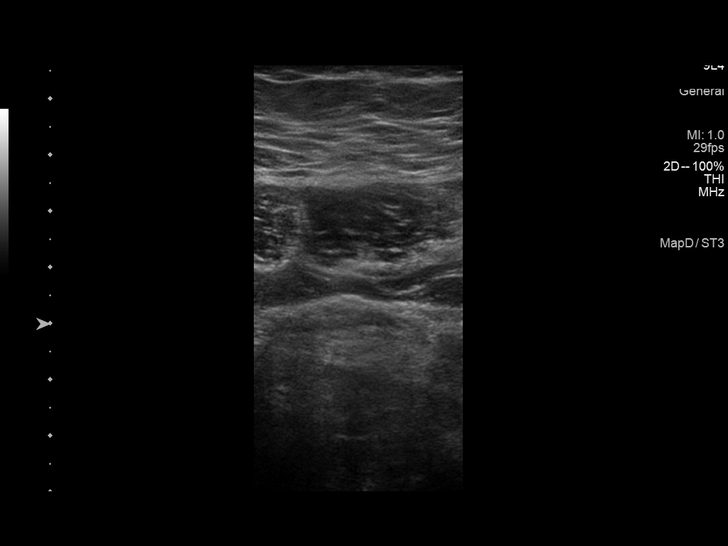
[im 2/10]
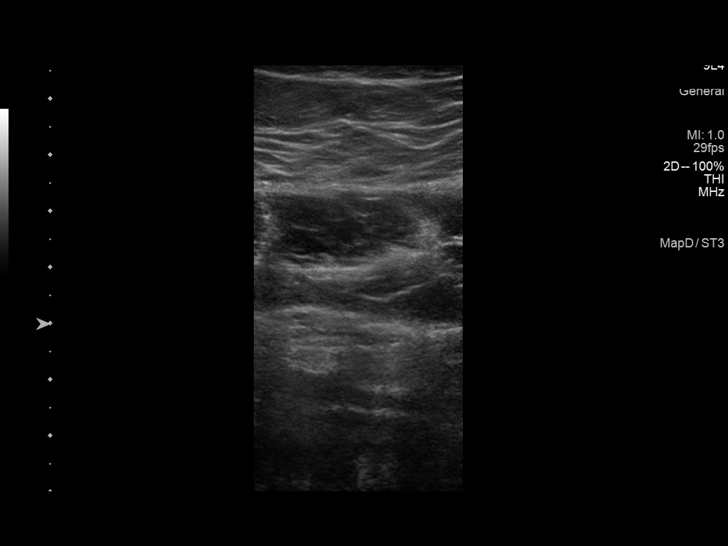
[im 3/10]
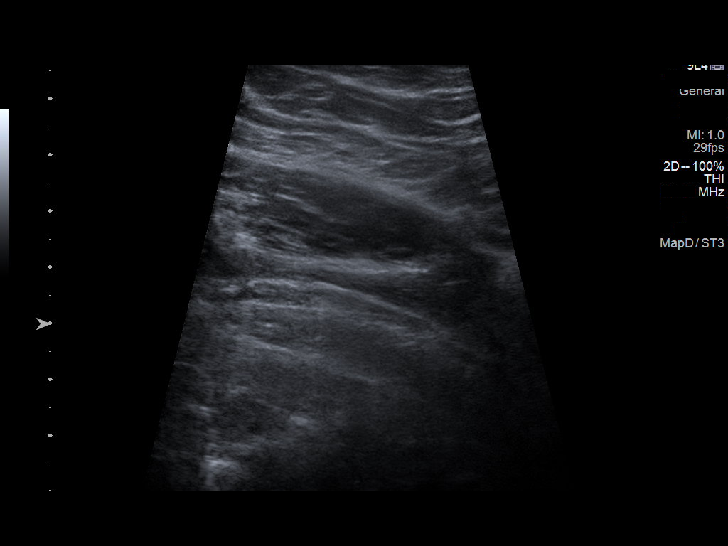
[im 4/10]
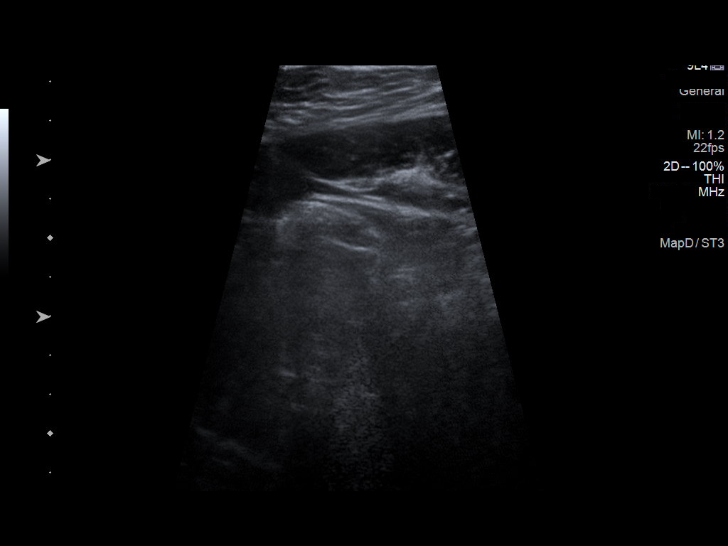
[im 5/10]
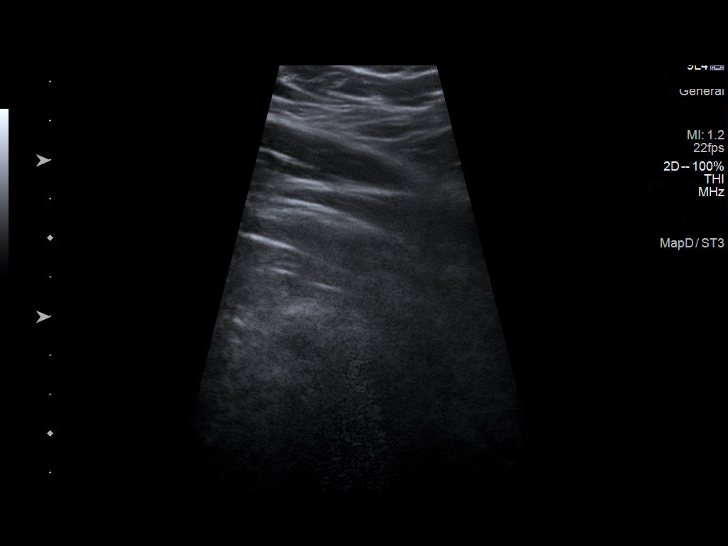
[im 6/10]
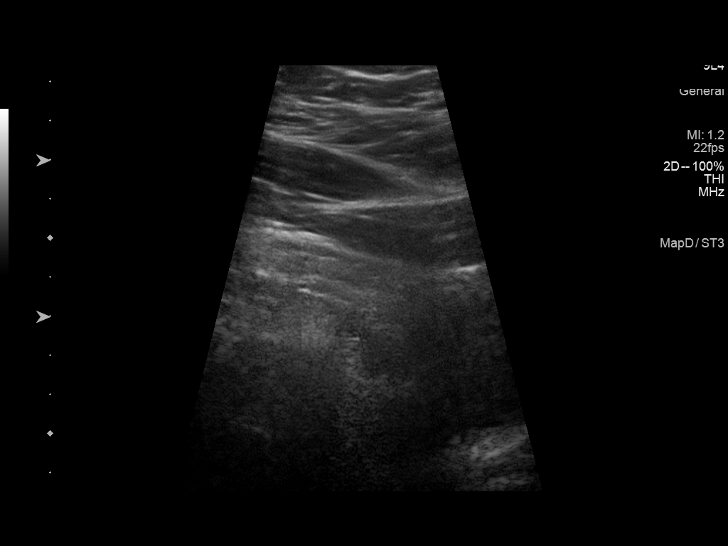
[im 7/10]
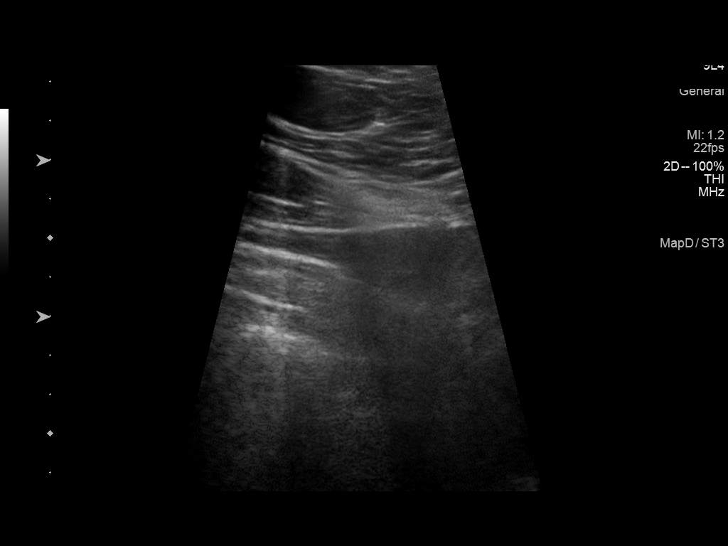
[im 8/10]
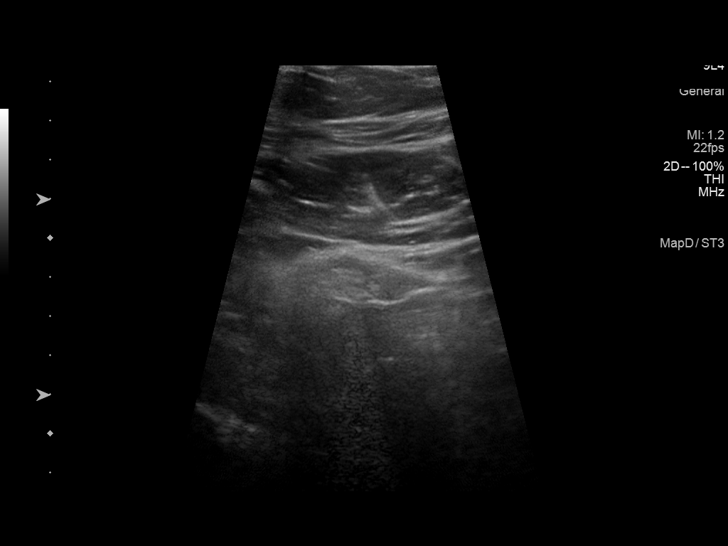
[im 9/10]
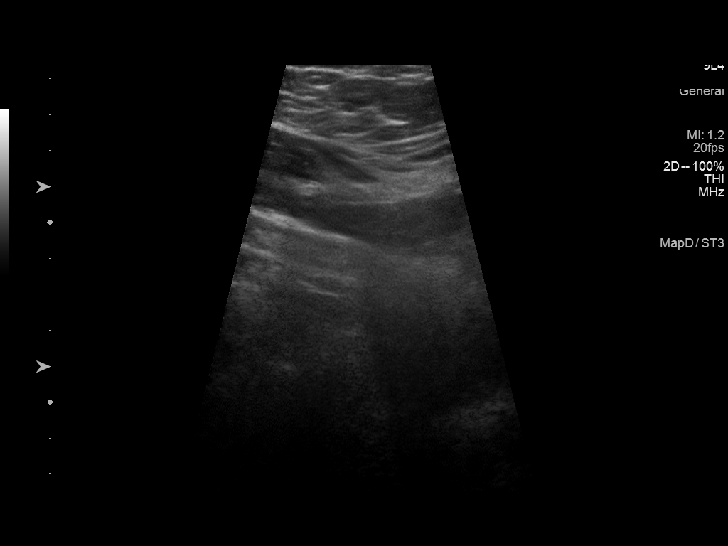
[im 10/10]
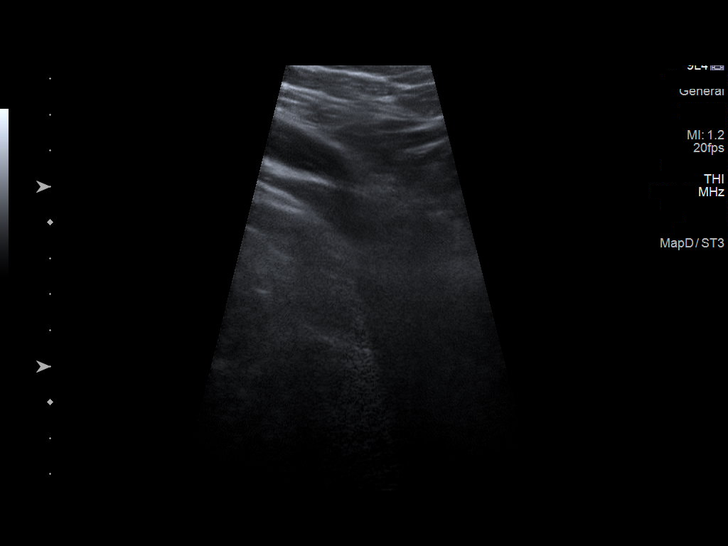

[10 of 10 positions shown; findings below may reference images not displayed]

FINDINGS: Longitudinal and transverse images of the left lower abdomen and
inguinal regions obtained pre and post Valsalva maneuver. There is
suggestion of a hernia in the left inguinal region containing fat.
No bowel is appreciable in this area of questionable hernia. There
is no mass or inflammatory focus. No fluid evident in the left lower
quadrant/inguinal regions.
IMPRESSION: Suspect left inguinal region hernia containing fat. No appreciable
bowel in this area. This finding warrants CT, ideally with oral and
intravenous contrast, to further assess the left inguinal region.

Study otherwise unremarkable.

## 2022-03-25 ENCOUNTER — Other Ambulatory Visit: Payer: Self-pay

## 2022-03-25 ENCOUNTER — Encounter (HOSPITAL_COMMUNITY): Payer: Self-pay

## 2022-03-25 ENCOUNTER — Emergency Department (HOSPITAL_COMMUNITY): Payer: 59

## 2022-03-25 ENCOUNTER — Emergency Department (HOSPITAL_COMMUNITY)
Admission: EM | Admit: 2022-03-25 | Discharge: 2022-03-26 | Disposition: A | Discharge status: Home / Self Care | Payer: 59 | Attending: Emergency Medicine | Admitting: Emergency Medicine

## 2022-03-25 DIAGNOSIS — R079 Chest pain, unspecified: Secondary | ICD-10-CM

## 2022-03-25 DIAGNOSIS — R197 Diarrhea, unspecified: Secondary | ICD-10-CM | POA: Insufficient documentation

## 2022-03-25 DIAGNOSIS — R0789 Other chest pain: Secondary | ICD-10-CM | POA: Insufficient documentation

## 2022-03-25 DIAGNOSIS — Z1152 Encounter for screening for COVID-19: Secondary | ICD-10-CM | POA: Diagnosis not present

## 2022-03-25 DIAGNOSIS — R110 Nausea: Secondary | ICD-10-CM | POA: Insufficient documentation

## 2022-03-25 DIAGNOSIS — Z7982 Long term (current) use of aspirin: Secondary | ICD-10-CM | POA: Diagnosis not present

## 2022-03-25 DIAGNOSIS — R0602 Shortness of breath: Secondary | ICD-10-CM | POA: Diagnosis not present

## 2022-03-25 DIAGNOSIS — R000 Tachycardia, unspecified: Secondary | ICD-10-CM | POA: Diagnosis not present

## 2022-03-25 DIAGNOSIS — R1012 Left upper quadrant pain: Secondary | ICD-10-CM | POA: Diagnosis not present

## 2022-03-25 LAB — BASIC METABOLIC PANEL
Anion gap: 12 (ref 5–15)
BUN: 13 mg/dL (ref 6–20)
CO2: 22 mmol/L (ref 22–32)
Calcium: 9.4 mg/dL (ref 8.9–10.3)
Chloride: 104 mmol/L (ref 98–111)
Creatinine, Ser: 0.94 mg/dL (ref 0.61–1.24)
GFR, Estimated: >60 mL/min (ref >60)
Glucose, Bld: 99 mg/dL (ref 70–99)
Potassium: 4.1 mmol/L (ref 3.5–5.1)
Sodium: 138 mmol/L (ref 135–145)

## 2022-03-25 LAB — RESP PANEL BY RT-PCR (RSV, FLU A&B, COVID)  RVPGX2
Influenza A by PCR: NEGATIVE
Influenza B by PCR: NEGATIVE
Resp Syncytial Virus by PCR: NEGATIVE
SARS Coronavirus 2 by RT PCR: NEGATIVE

## 2022-03-25 LAB — CBC
HCT: 43.3 % (ref 39.0–52.0)
Hemoglobin: 14.4 g/dL (ref 13.0–17.0)
MCH: 27.3 pg (ref 26.0–34.0)
MCHC: 33.3 g/dL (ref 30.0–36.0)
MCV: 82.2 fL (ref 80.0–100.0)
Platelets: 336 10*3/uL (ref 150–400)
RBC: 5.27 MIL/uL (ref 4.22–5.81)
RDW: 14.3 % (ref 11.5–15.5)
WBC: 10 10*3/uL (ref 4.0–10.5)
nRBC: 0 % (ref 0.0–0.2)

## 2022-03-25 LAB — TROPONIN I (HIGH SENSITIVITY)
Troponin I (High Sensitivity): 3 ng/L (ref <18)
Troponin I (High Sensitivity): 3 ng/L (ref <18)

## 2022-03-25 MED ORDER — ASPIRIN 81 MG PO CHEW
324.0000 mg | CHEWABLE_TABLET | Freq: Once | ORAL | Status: AC
  Administered 2022-03-25: 324 mg via ORAL
  Filled 2022-03-25: qty 4

## 2022-03-25 NOTE — ED Provider Triage Note (Signed)
Emergency Medicine Provider Triage Evaluation Note  Aaron Wheeler , a 32 y.o. male  was evaluated in triage.  Pt complains of chest pain.  Patient states that he will started having some nausea earlier today followed by onset of left-sided squeezing chest pain that radiates to his back and left arm.  No personal history of CAD or diabetes or early family history of MI however patient does not follow regularly with doctors.  He has a history of smoking but now vapes nicotine..  Review of Systems  Positive: cp Negative: vomiting  Physical Exam  BP (!) 142/92 (BP Location: Left Arm)   Pulse (!) 109   Temp 97.8 F (36.6 C)   Resp 19   Ht 5' 7"$  (1.702 m)   Wt (!) 186 kg   SpO2 97%   BMI 64.22 kg/m  Gen:   Awake, no distress   Resp:  Normal effort  MSK:   Moves extremities without difficulty  Other:    Medical Decision Making  Medically screening exam initiated at 6:21 PM.  Appropriate orders placed.  Paxtyn Evard was informed that the remainder of the evaluation will be completed by another provider, this initial triage assessment does not replace that evaluation, and the importance of remaining in the ED until their evaluation is complete.  Workup initiated   Margarita Mail, PA-C 03/25/22 1941

## 2022-03-25 NOTE — ED Triage Notes (Signed)
Reports chest pain that started around 1pm that radiates to left arm and back.  +sob +nausea.

## 2022-03-26 MED ORDER — LIDOCAINE VISCOUS HCL 2 % MT SOLN
15.0000 mL | Freq: Once | OROMUCOSAL | Status: AC
  Administered 2022-03-26: 15 mL via ORAL
  Filled 2022-03-26: qty 15

## 2022-03-26 MED ORDER — ALUM & MAG HYDROXIDE-SIMETH 200-200-20 MG/5ML PO SUSP
30.0000 mL | Freq: Once | ORAL | Status: AC
  Administered 2022-03-26: 30 mL via ORAL
  Filled 2022-03-26: qty 30

## 2022-03-26 NOTE — ED Provider Notes (Signed)
[facility] Provider Note  CSN: [id number] Arrival date & time: 03/25/22 1732  Chief Complaint(s) Chest Pain  HPI Aaron Wheeler is a 32 y.o. male with no pertinent past medical history who presents to the emergency department with central substernal chest pain described as squeezing sensation that was nonradiating.  Pain was intermittent in nature and fluctuating in intensity.  No consistent alleviating or aggravating factors but patient did report that activity while at work that make it feel more intense a few times.  Patient has associated shortness of breath.  He reports nausea but this preceded the chest pain by several hours.  Patient also reports upset stomach and diarrhea earlier in the day.  Patient denied any recent fevers or infections.  No coughing or congestion.  Pain did improved after taking aspirin here in the emergency department. Still has some discomfort.   The history is provided by the patient.    Past Medical History Past Medical History:  Diagnosis Date   Thyroid disease    hyperthyroidsm   There are no problems to display for this patient.  Home Medication(s) Prior to Admission medications   Medication Sig Start Date End Date Taking? Authorizing Provider  albuterol (VENTOLIN HFA) 108 (90 Base) MCG/ACT inhaler Inhale 1-2 puffs into the lungs every 6 (six) hours as needed for wheezing or shortness of breath. 02/20/22   Alcus Dad, MD  hydrOXYzine (ATARAX/VISTARIL) 25 MG tablet Take 1 tablet (25 mg total) by mouth every 6 (six) hours as needed for anxiety. 12/11/17   Duffy Bruce, MD  naproxen sodium (ALEVE) 220 MG tablet Take 660 mg by mouth daily as needed (pain).    [provider]  ondansetron (ZOFRAN-ODT) 4 MG disintegrating tablet Take 1 tablet (4 mg total) by mouth every 8 (eight) hours as needed for nausea or vomiting. 12/04/21   Carlisle Cater, PA-C  predniSONE (DELTASONE) 10 MG tablet Take 4  tabs by mouth once daily x4 days, then 3 tabs x4 days, 2 tabs x4 days, 1 tab x4 days and stop. 02/26/22   Mazzola Nash, DO                                                                                                                                    Allergies Ceclor [cefaclor] and Tylenol [acetaminophen]  Review of Systems Review of Systems As noted in HPI  Physical Exam Vital Signs  I have reviewed the triage vital signs BP 114/84   Pulse 76   Temp 98.4 F (36.9 C) (Oral)   Resp 12   Ht 5' 7"$  (1.702 m)   Wt (!) 186 kg   SpO2 97%   BMI 64.22 kg/m   Physical Exam Constitutional:      Appearance: He is morbidly obese.  HENT:     Mouth/Throat:     Dentition: Abnormal dentition.  Abdominal:     Tenderness:  There is abdominal tenderness (mild) in the left upper quadrant. There is no guarding or rebound. Negative signs include Murphy's sign.     ED Results and Treatments Labs (all labs ordered are listed, but only abnormal results are displayed) Labs Reviewed  RESP PANEL BY RT-PCR (RSV, FLU A&B, COVID)  [id number]  BASIC METABOLIC PANEL  CBC  TROPONIN I (HIGH SENSITIVITY)  TROPONIN I (HIGH SENSITIVITY)                                                                                                                         EKG  EKG Interpretation  Date/Time:  Tuesday March 25 2022 17:45:24 EST Ventricular Rate:  112 PR Interval:  142 QRS Duration: 84 QT Interval:  304 QTC Calculation: 414 R Axis:   39 Text Interpretation: Sinus tachycardia Otherwise normal ECG When compared with ECG of 11-Dec-2017 20:20, PREVIOUS ECG IS PRESENT Confirmed by Addison Lank [id number]) on 03/26/2022 12:36:43 AM       Radiology DG Chest 2 View  Result Date: 03/25/2022 CLINICAL DATA:  Chest pain EXAM: CHEST - 2 VIEW COMPARISON:  02/20/2022 FINDINGS: The heart size and mediastinal contours are within normal limits. Both lungs are clear. The visualized skeletal structures are  unremarkable. IMPRESSION: No active cardiopulmonary disease. Electronically Signed   By: Inez Catalina M.D.   On: 03/25/2022 19:07    Medications Ordered in ED Medications  aspirin chewable tablet 324 mg (324 mg Oral Given 03/25/22 1831)  alum & mag hydroxide-simeth (MAALOX/MYLANTA) 200-200-20 MG/5ML suspension 30 mL (30 mLs Oral Given 03/26/22 0113)    And  lidocaine (XYLOCAINE) 2 % viscous mouth solution 15 mL (15 mLs Oral Given 03/26/22 0114)                                                                                                                                     Procedures Procedures  (including critical care time)  Medical Decision Making / ED Course   Medical Decision Making Amount and/or Complexity of Data Reviewed Labs: ordered. Decision-making details documented in ED Course. Radiology: ordered and independent interpretation performed. Decision-making details documented in ED Course. ECG/medicine tests: ordered and independent interpretation performed. Decision-making details documented in ED Course.  Risk OTC drugs. Prescription drug management.   This patient presents to the ED for concern of chest pain, this involves an extensive number of treatment options, and is a  complaint that carries with it a high risk of complications and morbidity. The differential diagnosis includes but not limited to GERD/GI related, ACS, MSK. Doubt PE, PTX, PNA, Dissection.  EKG without acute ischemic changes, dysrhythmias or blocks.  No evidence of pericarditis. CBC without leukocytosis or anemia Metabolic panel without significant electrolyte derangements or renal sufficiency Serial troponins negative x 2.  Heart score less than 3.   On my read of the chest x-ray, there was no evidence suggestive of pneumonia, pneumothorax, pneumomediastinum, pulmonary edema concerning for new or exacerbation of heart failure, abnormal contour of the mediastinum to suggest dissection, and no evidence  of acute injuries.    Provided with GI cocktail, significantly improved his discomfort.       Final Clinical Impression(s) / ED Diagnoses Final diagnoses:  Intermittent chest pain   The patient appears reasonably screened and/or stabilized for discharge and I doubt any other medical condition or other [facility] requiring further screening, evaluation, or treatment in the ED at this time. I have discussed the findings, Dx and Tx plan with the patient/family who expressed understanding and agree(s) with the plan. Discharge instructions discussed at length. The patient/family was given strict return precautions who verbalized understanding of the instructions. No further questions at time of discharge.  Disposition: Discharge  Condition: Good  ED Discharge Orders     None         Follow Up: Primary care provider  Schedule an appointment as soon as possible for a visit  if you do not have a primary care physician, contact [facility] at [phone] for referral           This chart was dictated using voice recognition software.  Despite best efforts to proofread,  errors can occur which can change the documentation meaning.    Fatima Blank, MD 03/26/22 [phone]

## 2022-06-13 ENCOUNTER — Other Ambulatory Visit: Payer: Self-pay

## 2022-06-13 ENCOUNTER — Emergency Department (HOSPITAL_BASED_OUTPATIENT_CLINIC_OR_DEPARTMENT_OTHER)
Admission: EM | Admit: 2022-06-13 | Discharge: 2022-06-13 | Disposition: A | Discharge status: Home / Self Care | Payer: 59 | Attending: Emergency Medicine | Admitting: Emergency Medicine

## 2022-06-13 ENCOUNTER — Encounter (HOSPITAL_BASED_OUTPATIENT_CLINIC_OR_DEPARTMENT_OTHER): Payer: Self-pay

## 2022-06-13 DIAGNOSIS — M545 Low back pain, unspecified: Secondary | ICD-10-CM

## 2022-06-13 DIAGNOSIS — R112 Nausea with vomiting, unspecified: Secondary | ICD-10-CM | POA: Insufficient documentation

## 2022-06-13 LAB — BASIC METABOLIC PANEL
Anion gap: 9 (ref 5–15)
BUN: 16 mg/dL (ref 6–20)
CO2: 27 mmol/L (ref 22–32)
Calcium: 9.4 mg/dL (ref 8.9–10.3)
Chloride: 102 mmol/L (ref 98–111)
Creatinine, Ser: 0.87 mg/dL (ref 0.61–1.24)
GFR, Estimated: >60 mL/min (ref >60)
Glucose, Bld: 92 mg/dL (ref 70–99)
Potassium: 4.3 mmol/L (ref 3.5–5.1)
Sodium: 138 mmol/L (ref 135–145)

## 2022-06-13 LAB — CBC WITH DIFFERENTIAL/PLATELET
Abs Immature Granulocytes: 0.09 10*3/uL — ABNORMAL HIGH (ref 0.00–0.07)
Basophils Absolute: 0.1 10*3/uL (ref 0.0–0.1)
Basophils Relative: 1 %
Eosinophils Absolute: 0.2 10*3/uL (ref 0.0–0.5)
Eosinophils Relative: 3 %
HCT: 42.2 % (ref 39.0–52.0)
Hemoglobin: 13.8 g/dL (ref 13.0–17.0)
Immature Granulocytes: 1 %
Lymphocytes Relative: 35 %
Lymphs Abs: 3 10*3/uL (ref 0.7–4.0)
MCH: 27.2 pg (ref 26.0–34.0)
MCHC: 32.7 g/dL (ref 30.0–36.0)
MCV: 83.2 fL (ref 80.0–100.0)
Monocytes Absolute: 0.6 10*3/uL (ref 0.1–1.0)
Monocytes Relative: 7 %
Neutro Abs: 4.7 10*3/uL (ref 1.7–7.7)
Neutrophils Relative %: 53 %
Platelets: 323 10*3/uL (ref 150–400)
RBC: 5.07 MIL/uL (ref 4.22–5.81)
RDW: 14.2 % (ref 11.5–15.5)
WBC: 8.7 10*3/uL (ref 4.0–10.5)
nRBC: 0 % (ref 0.0–0.2)

## 2022-06-13 LAB — URINALYSIS, ROUTINE W REFLEX MICROSCOPIC
Bilirubin Urine: NEGATIVE
Glucose, UA: NEGATIVE mg/dL
Hgb urine dipstick: NEGATIVE
Ketones, ur: NEGATIVE mg/dL
Leukocytes,Ua: NEGATIVE
Nitrite: NEGATIVE
Protein, ur: NEGATIVE mg/dL
Specific Gravity, Urine: 1.026 (ref 1.005–1.030)
pH: 6 (ref 5.0–8.0)

## 2022-06-13 MED ORDER — LOPERAMIDE HCL 2 MG PO CAPS
4.0000 mg | ORAL_CAPSULE | Freq: Once | ORAL | Status: AC
  Administered 2022-06-13: 4 mg via ORAL
  Filled 2022-06-13: qty 2

## 2022-06-13 MED ORDER — KETOROLAC TROMETHAMINE 30 MG/ML IJ SOLN
30.0000 mg | Freq: Once | INTRAMUSCULAR | Status: AC
  Administered 2022-06-13: 30 mg via INTRAVENOUS
  Filled 2022-06-13: qty 1

## 2022-06-13 MED ORDER — ONDANSETRON 8 MG PO TBDP: 8.0000 mg | ORAL_TABLET | Freq: Three times a day (TID) | ORAL | 0 refills | Status: DC | PRN

## 2022-06-13 MED ORDER — SODIUM CHLORIDE 0.9 % IV BOLUS
1000.0000 mL | Freq: Once | INTRAVENOUS | Status: AC
  Administered 2022-06-13: 1000 mL via INTRAVENOUS

## 2022-06-13 MED ORDER — ONDANSETRON HCL 4 MG/2ML IJ SOLN
4.0000 mg | Freq: Once | INTRAMUSCULAR | Status: AC
  Administered 2022-06-13: 4 mg via INTRAVENOUS
  Filled 2022-06-13: qty 2

## 2022-06-13 NOTE — ED Triage Notes (Signed)
Patient states he is having bilateral lower back pain, thinks it may be muscle pain from vomiting since Sunday. Patient denies any urinary symptoms.

## 2022-06-13 NOTE — Discharge Instructions (Signed)
Drink plenty of fluids.  Take loperamide (Imodium A-D) as needed for diarrhea.  You may take ibuprofen or naproxen as needed for pain.

## 2022-06-13 NOTE — ED Notes (Signed)
Reviewed AVS with patient, patient expressed understanding of directions, denies further questions at this time. 

## 2022-06-13 NOTE — ED Provider Notes (Signed)
[place] EMERGENCY DEPARTMENT AT [facility] Provider Note   CSN: [id number] Arrival date & time: 06/13/22  4098     History  Chief Complaint  Patient presents with   Back Pain    Aaron Wheeler is a 32 y.o. male.  The history is provided by the patient.  Back Pain He has been having vomiting and diarrhea for the last 5 days-estimating 5-6 loose bowel movements a day and vomiting several times a day.  He denies fever, chills, sweats.  He denies any sick contacts or suspicious food intake.  Over the last 24 hours, he has had pain in his lower back which he thinks may be pulled muscle but he wanted to make sure it was not anything more serious.  He has not taken anything for any of his symptoms.   Home Medications Prior to Admission medications   Medication Sig Start Date End Date Taking? Authorizing Provider  albuterol (VENTOLIN HFA) 108 (90 Base) MCG/ACT inhaler Inhale 1-2 puffs into the lungs every 6 (six) hours as needed for wheezing or shortness of breath. 02/20/22   Maury Dus, MD  hydrOXYzine (ATARAX/VISTARIL) 25 MG tablet Take 1 tablet (25 mg total) by mouth every 6 (six) hours as needed for anxiety. 12/11/17   Shaune Pollack, MD  naproxen sodium (ALEVE) 220 MG tablet Take 660 mg by mouth daily as needed (pain).    [provider]  ondansetron (ZOFRAN-ODT) 4 MG disintegrating tablet Take 1 tablet (4 mg total) by mouth every 8 (eight) hours as needed for nausea or vomiting. 12/04/21   Renne Crigler, PA-C  predniSONE (DELTASONE) 10 MG tablet Take 4 tabs by mouth once daily x4 days, then 3 tabs x4 days, 2 tabs x4 days, 1 tab x4 days and stop. 02/26/22   Josephine Igo, DO      Allergies    Ceclor [cefaclor] and Tylenol [acetaminophen]    Review of Systems   Review of Systems  Musculoskeletal:  Positive for back pain.  All other systems reviewed and are negative.   Physical Exam Updated Vital Signs BP (!) 159/109   Pulse 76   Temp 98 F (36.7 C)  (Oral)   Resp 18   Ht 5\' 7"  (1.702 m)   Wt (!) 181.4 kg   SpO2 98%   BMI 62.65 kg/m  Physical Exam Vitals and nursing note reviewed.   Morbidly obese 32 year old male, resting comfortably and in no acute distress. Vital signs are significant for elevated blood pressure. Oxygen saturation is 98%, which is normal. Head is normocephalic and atraumatic. PERRLA, EOMI.  Neck is nontender and supple without adenopathy or JVD. Back is mildly tender in the paralumbar area bilaterally.  There is no midline tenderness and no CVA tenderness.  Straight leg raise is negative.  There is moderate bilateral paralumbar spasm. Lungs are clear without rales, wheezes, or rhonchi. Chest is nontender. Heart has regular rate and rhythm without murmur. Abdomen is soft, flat, nontender. Extremities have no cyanosis or edema, full range of motion is present. Skin is warm and dry without rash. Neurologic: Mental status is normal, cranial nerves are intact, moves all extremities equally.  ED Results / Procedures / Treatments   Labs (all labs ordered are listed, but only abnormal results are displayed) Labs Reviewed  CBC WITH DIFFERENTIAL/PLATELET - Abnormal; Notable for the following components:      Result Value   Abs Immature Granulocytes 0.09 (*)    All other components within normal limits  BASIC METABOLIC PANEL  URINALYSIS, ROUTINE W REFLEX MICROSCOPIC   Procedures Procedures    Medications Ordered in ED Medications  sodium chloride 0.9 % bolus 1,000 mL (0 mLs Intravenous Stopped 06/13/22 0638)  ondansetron (ZOFRAN) injection 4 mg (4 mg Intravenous Given 06/13/22 0540)  ketorolac (TORADOL) 30 MG/ML injection 30 mg (30 mg Intravenous Given 06/13/22 0540)  loperamide (IMODIUM) capsule 4 mg (4 mg Oral Given 06/13/22 0529)    ED Course/ Medical Decision Making/ A&P                             Medical Decision Making Amount and/or Complexity of Data Reviewed Labs: ordered.  Risk Prescription  drug management.   Nausea, vomiting, diarrhea strongly suggestive of viral gastroenteritis versus food poisoning.  I have low index of suspicion for other conditions such as small bowel obstruction, diverticulitis.  I have ordered IV fluids, ondansetron for nausea, ketorolac for pain.  I have ordered oral loperamide.  I have ordered laboratory testing of CBC, basic metabolic panel, urinalysis.  I have reviewed and interpreted his laboratory test, and my interpretation is normal CBC, normal basic metabolic panel, normal urinalysis although specific gravity is borderline high indicating perhaps mild dehydration.  Following above-noted treatment, he noted that he was feeling much better.  I am discharging with a prescription for ondansetron oral dissolving tablet and have advised him to use over-the-counter loperamide as needed for diarrhea, ibuprofen as needed for pain.  Return precautions discussed.  Final Clinical Impression(s) / ED Diagnoses Final diagnoses:  Nausea vomiting and diarrhea  Acute bilateral low back pain without sciatica    Rx / DC Orders ED Discharge Orders          Ordered    ondansetron (ZOFRAN-ODT) 8 MG disintegrating tablet  Every 8 hours PRN        06/13/22 0640              Dione Booze, MD 06/13/22 [phone]

## 2022-07-12 ENCOUNTER — Other Ambulatory Visit: Payer: Self-pay

## 2022-07-12 ENCOUNTER — Emergency Department (HOSPITAL_BASED_OUTPATIENT_CLINIC_OR_DEPARTMENT_OTHER)
Admission: EM | Admit: 2022-07-12 | Discharge: 2022-07-12 | Disposition: A | Discharge status: Home / Self Care | Payer: Self-pay | Attending: Emergency Medicine | Admitting: Emergency Medicine

## 2022-07-12 ENCOUNTER — Encounter (HOSPITAL_BASED_OUTPATIENT_CLINIC_OR_DEPARTMENT_OTHER): Payer: Self-pay

## 2022-07-12 DIAGNOSIS — R21 Rash and other nonspecific skin eruption: Secondary | ICD-10-CM

## 2022-07-12 DIAGNOSIS — L259 Unspecified contact dermatitis, unspecified cause: Secondary | ICD-10-CM | POA: Insufficient documentation

## 2022-07-12 MED ORDER — DOXYCYCLINE HYCLATE 100 MG PO TABS
100.0000 mg | ORAL_TABLET | Freq: Once | ORAL | Status: AC
  Administered 2022-07-12: 100 mg via ORAL
  Filled 2022-07-12: qty 1

## 2022-07-12 MED ORDER — PREDNISONE 50 MG PO TABS
60.0000 mg | ORAL_TABLET | Freq: Once | ORAL | Status: AC
  Administered 2022-07-12: 60 mg via ORAL
  Filled 2022-07-12: qty 1

## 2022-07-12 MED ORDER — PREDNISONE 20 MG PO TABS: ORAL_TABLET | ORAL | 0 refills | Status: AC

## 2022-07-12 MED ORDER — HYDROXYZINE HCL 25 MG PO TABS
25.0000 mg | ORAL_TABLET | Freq: Once | ORAL | Status: AC
  Administered 2022-07-12: 25 mg via ORAL
  Filled 2022-07-12: qty 1

## 2022-07-12 MED ORDER — DOXYCYCLINE HYCLATE 100 MG PO CAPS: 100.0000 mg | ORAL_CAPSULE | Freq: Two times a day (BID) | ORAL | 0 refills | Status: DC

## 2022-07-12 MED ORDER — HYDROXYZINE HCL 25 MG PO TABS: 25.0000 mg | ORAL_TABLET | Freq: Four times a day (QID) | ORAL | 0 refills | Status: DC | PRN

## 2022-07-12 NOTE — ED Triage Notes (Signed)
"  Painful rash across top of back around both shoulder blades for several days. Have tried benadryl, creams and spray but can not get any relief" per pt

## 2022-07-12 NOTE — Discharge Instructions (Addendum)
You were seen for a rash.  Take the steroids as prescribed as well as the antibiotics doxycycline.  Use gentle unscented soaps on your skin.  Take the Atarax as needed for itching throughout the day.  You need to follow-up with a primary care doctor or dermatologist regarding your rash for recheck.  Take Tylenol and ibuprofen as needed for pain.  Come back to the ER if the rash is spreading, worsening, your skin is peeling, you have high fever, rash in your mouth, or any other symptoms concerning to you.

## 2022-07-12 NOTE — ED Provider Notes (Signed)
[place] EMERGENCY DEPARTMENT AT [facility] Provider Note   CSN: [id number] Arrival date & time: 07/12/22  7846     History  Chief Complaint  Patient presents with   Rash    Aaron Wheeler is a 32 y.o. male.  With PMH of anxiety, obesity, thyroid disease who presents with itching and burning rash.  Rash has been ongoing for the past week.  Started as a rash on his arm that was itchy and red with no discharge and now on the back as well as around folds on his back.  He has had no oral lesions, no fevers, no chills, no vomiting, no shortness of breath or wheezing.  Denies any new exposures to creams, lotions, body washes.  Denies any outdoor exposures to poison ivy or oak.  Has been using Benadryl, topical steroids without relief.  No history of shingles.  Complains of constant itching and unable to get relief as well as burning pain from itching. Denies any new medication use. No antibiotics or antipsychotics. No history of DM, cancer or immunocompromise.   Rash      Home Medications Prior to Admission medications   Medication Sig Start Date End Date Taking? Authorizing Provider  doxycycline (VIBRAMYCIN) 100 MG capsule Take 1 capsule (100 mg total) by mouth 2 (two) times daily. 07/12/22  Yes Mardene Sayer, MD  hydrOXYzine (ATARAX) 25 MG tablet Take 1 tablet (25 mg total) by mouth every 6 (six) hours as needed for up to 30 doses for itching. 07/12/22  Yes Mardene Sayer, MD  predniSONE (DELTASONE) 20 MG tablet Take 3 tablets (60 mg total) by mouth daily for 1 day, THEN 2.5 tablets (50 mg total) daily for 2 days, THEN 2 tablets (40 mg total) daily for 2 days, THEN 1.5 tablets (30 mg total) daily for 2 days, THEN 1 tablet (20 mg total) daily for 2 days, THEN 0.5 tablets (10 mg total) daily for 2 days. 07/13/22 07/24/22 Yes Mardene Sayer, MD  albuterol (VENTOLIN HFA) 108 (90 Base) MCG/ACT inhaler Inhale 1-2 puffs into the lungs every 6 (six) hours as needed for wheezing  or shortness of breath. 02/20/22   Maury Dus, MD  hydrOXYzine (ATARAX/VISTARIL) 25 MG tablet Take 1 tablet (25 mg total) by mouth every 6 (six) hours as needed for anxiety. 12/11/17   Shaune Pollack, MD  naproxen sodium (ALEVE) 220 MG tablet Take 660 mg by mouth daily as needed (pain).    [provider]  ondansetron (ZOFRAN-ODT) 8 MG disintegrating tablet Take 1 tablet (8 mg total) by mouth every 8 (eight) hours as needed for nausea or vomiting. 06/13/22   Dione Booze, MD      Allergies    Ceclor [cefaclor] and Tylenol [acetaminophen]    Review of Systems   Review of Systems  Skin:  Positive for rash.    Physical Exam Updated Vital Signs BP (!) 190/102   Pulse 86   Temp 98.2 F (36.8 C) (Oral)   Resp 17   Ht 5\' 7"  (1.702 m)   Wt (!) 186 kg   SpO2 99%   BMI 64.22 kg/m  Physical Exam Constitutional: Alert and oriented. Uncomfortable but nontoxic NAD. Eyes: Conjunctivae are normal. ENT      Mouth/Throat: Mucous membranes are moist. No lesions. No oral swelling. No angioedema.      Neck: No stridor. Cardiovascular: RRR Respiratory: Normal respiratory effort. Breath sounds are normal. O2 sat 99 on RA Gastrointestinal: Soft and nontender.  Musculoskeletal:  Normal range of motion in all extremities.  Neurologic: Normal speech and language. No gross focal neurologic deficits are appreciated. Skin: SEE MEDIA.  There is a clustered region of erythematous papular lesions on the right forearm.  There are scattered erythematous papular lesions on the bilateral posterior shoulders.  There are no fluctuant lesions.  There are no vesicular lesions.  There is associated excoriations present.  There is no drainage present.  There are no bullae. Psychiatric: Mood and affect are normal. Speech and behavior are normal.        ED Results / Procedures / Treatments   Labs (all labs ordered are listed, but only abnormal results are displayed) Labs Reviewed - No data to  display  EKG None  Radiology No results found.  Procedures Procedures    Medications Ordered in ED Medications  predniSONE (DELTASONE) tablet 60 mg (60 mg Oral Given 07/12/22 0814)  hydrOXYzine (ATARAX) tablet 25 mg (25 mg Oral Given 07/12/22 0814)  doxycycline (VIBRA-TABS) tablet 100 mg (100 mg Oral Given 07/12/22 7829)    ED Course/ Medical Decision Making/ A&P                             Medical Decision Making  Aaron Wheeler is a 32 y.o. male.  With PMH of anxiety, obesity, thyroid disease who presents with itching and burning rash.  Rash has been ongoing for the past week.  Patient's rash seems most consistent with contact dermatitis.  I considered disseminated VZV however less likely as crossing midline's and no vesicular lesions or fluid-filled lesions.  Will trial steroid taper as he is failing topical steroids over-the-counter, Atarax as needed for itching and doxycycline for coverage for superimposed cellulitis from scratching. Advised unscented soap use.  Did discuss close follow-up with PCP/dermatology and strict return precautions.  He is in agreement with these plans and will trial the medications   Considering alternative and potentially life-threatening etiologies of rash:  No  pustules, involvement of skin folds or fever to suggest acute generalized exanthematous pustulosis (AGEP, commonly occuring within hours to days of new drug exposure).  No wheezing, throat or oropharyngeal tightness/swelling, lightheadedness, hypotension/tachycardia, or recent exposure to suggest anaphylaxis  No oropharyngeal swelling, edema, drooling, or voice change to suggest angioedema  No systemic symptoms including fever, lymphadenopathy, arthritis, hematuria, abdominal pain or symptoms of visceral inflammation to suggest Drug Reaction wit Eosinophilia and Systemic Symptoms or DRESS (commonly seen 2-6 weeks after new drug exposure)  No diffuse erythematous rash involving more than 90%  TBSA, history of psoriasis or atopic dermatitis, new drug exposure or exam findings (hyperthermia, tachycardia, peripheral edema or generalized lymphadenopathy) to suggest erythroderma  No fever, other systemic symptoms, erythema, warmth, focal tenderness, crepitance or pain out of proportion to exam to suggest significant cellulitis including necrotizing fasciitis  No blisters or bullae to suggest pemphigoid or pemphigus diseases.  No involvement of mucosal surfaces, no painful rash, no skin breakdown with pressure to suggest Steven's Johnson's Syndrome or toxic epidermal necrolysis   No fever, petechiae, hemodynamic instability, toxic appearance, evidence of end organ involvement or limb ischemia to suggest serious systemic infectious cause including bacteremia, septic emboli or meningococcemia  No petechiae, palpable purpura or arthritis, to suggest vasculitits.   Risk Prescription drug management.      Final Clinical Impression(s) / ED Diagnoses Final diagnoses:  Contact dermatitis, unspecified contact dermatitis type, unspecified trigger  Rash    Rx / DC Orders ED  Discharge Orders          Ordered    predniSONE (DELTASONE) 20 MG tablet  Daily        07/12/22 0806    hydrOXYzine (ATARAX) 25 MG tablet  Every 6 hours PRN        07/12/22 0806    doxycycline (VIBRAMYCIN) 100 MG capsule  2 times daily        07/12/22 0806              Mardene Sayer, MD 07/12/22 1926

## 2022-08-27 ENCOUNTER — Emergency Department (HOSPITAL_BASED_OUTPATIENT_CLINIC_OR_DEPARTMENT_OTHER): Payer: Self-pay

## 2022-08-27 ENCOUNTER — Encounter (HOSPITAL_BASED_OUTPATIENT_CLINIC_OR_DEPARTMENT_OTHER): Payer: Self-pay | Admitting: Radiology

## 2022-08-27 ENCOUNTER — Other Ambulatory Visit: Payer: Self-pay

## 2022-08-27 ENCOUNTER — Emergency Department (HOSPITAL_BASED_OUTPATIENT_CLINIC_OR_DEPARTMENT_OTHER)
Admission: EM | Admit: 2022-08-27 | Discharge: 2022-08-27 | Disposition: A | Discharge status: Home / Self Care | Payer: Self-pay | Attending: Emergency Medicine | Admitting: Emergency Medicine

## 2022-08-27 ENCOUNTER — Other Ambulatory Visit (HOSPITAL_BASED_OUTPATIENT_CLINIC_OR_DEPARTMENT_OTHER): Payer: Self-pay

## 2022-08-27 DIAGNOSIS — I861 Scrotal varices: Secondary | ICD-10-CM | POA: Insufficient documentation

## 2022-08-27 DIAGNOSIS — R109 Unspecified abdominal pain: Secondary | ICD-10-CM

## 2022-08-27 LAB — URINALYSIS, ROUTINE W REFLEX MICROSCOPIC
Bilirubin Urine: NEGATIVE
Glucose, UA: NEGATIVE mg/dL
Hgb urine dipstick: NEGATIVE
Ketones, ur: NEGATIVE mg/dL
Leukocytes,Ua: NEGATIVE
Nitrite: NEGATIVE
Protein, ur: NEGATIVE mg/dL
Specific Gravity, Urine: 1.026 (ref 1.005–1.030)
pH: 6.5 (ref 5.0–8.0)

## 2022-08-27 MED ORDER — KETOROLAC TROMETHAMINE 30 MG/ML IJ SOLN
30.0000 mg | Freq: Once | INTRAMUSCULAR | Status: AC
  Administered 2022-08-27: 30 mg via INTRAMUSCULAR
  Filled 2022-08-27: qty 1

## 2022-08-27 MED ORDER — OXYCODONE HCL 5 MG PO TABS: 5.0000 mg | ORAL_TABLET | Freq: Four times a day (QID) | ORAL | 0 refills | Status: DC | PRN

## 2022-08-27 NOTE — ED Triage Notes (Signed)
Reports left testicle pain, tenderness, and swelling. Some flank pain noted as well. Some worsening pain with urination and bearing down. Had [place] several years ago for possible hernia.

## 2022-08-27 NOTE — ED Provider Notes (Signed)
  Provider Note MRN:  [id number]  Arrival date & time: 08/27/22    ED Course and Medical Decision Making  Assumed care from Dr Renaye Rakers  at shift change.  See note from prior team for complete details, in brief:  32 yo male Complaint left inguinal pain Worse with defecation Exam stable Scrotal u/s with varicocele b/l Scrotal exam wnl per prior team CT pending  Plan per prior physician f/u imaging  CT abdomen w/o contrast was stable US scrotum with varicocele > will give uro f/u  Pain may also be secondary to varicocele  Give urology follow up  Also possible inguinal hernia, give gen surg f/u   The patient improved significantly and was discharged in stable condition. Detailed discussions were had with the patient regarding current findings, and need for close f/u with PCP or on call doctor. The patient has been instructed to return immediately if the symptoms worsen in any way for re-evaluation. Patient verbalized understanding and is in agreement with current care plan. All questions answered prior to discharge.     Procedures  Final Clinical Impressions(s) / ED Diagnoses     ICD-10-CM   1. 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 Abdominal pain, unspecified abdominal location  R10.9       ED Discharge Orders          Ordered    oxyCODONE (ROXICODONE) 5 MG immediate release tablet  Every 6 hours PRN        08/27/22 1751              Discharge Instructions      Recommend you wear supportive underwear/undergarments  Follow up with urology in regards to varicocele bilateral   It was a pleasure caring for you today in the emergency department.  Please return to the emergency department for any worsening or worrisome symptoms.          Sloan Leiter, DO 08/27/22 1757

## 2022-08-27 NOTE — ED Provider Notes (Signed)
[place] EMERGENCY DEPARTMENT AT [facility] Provider Note   CSN: [id number] Arrival date & time: 08/27/22  1242     History  Chief Complaint  Patient presents with   Testicle Pain    Aaron Wheeler is a 32 y.o. male with history of obesity presented to ED with left-sided lower abdominal pain and dysuria.  Patient reports that he has had the symptoms for several days but they worsened in the past night.  They are worse with bearing down and particularly with bowel movements.  He has had some nausea.  He has discomfort in left lower side of his abdomen and says that a few years ago he had an ultrasound that showed a possible hernia, but this never been confirmed.  He does have a history of varicoceles.  He does feel some discomfort in his left testicle.  HPI     Home Medications Prior to Admission medications   Medication Sig Start Date End Date Taking? Authorizing Provider  albuterol (VENTOLIN HFA) 108 (90 Base) MCG/ACT inhaler Inhale 1-2 puffs into the lungs every 6 (six) hours as needed for wheezing or shortness of breath. 02/20/22   Maury Dus, MD  doxycycline (VIBRAMYCIN) 100 MG capsule Take 1 capsule (100 mg total) by mouth 2 (two) times daily. 07/12/22   Mardene Sayer, MD  hydrOXYzine (ATARAX) 25 MG tablet Take 1 tablet (25 mg total) by mouth every 6 (six) hours as needed for up to 30 doses for itching. 07/12/22   Mardene Sayer, MD  hydrOXYzine (ATARAX/VISTARIL) 25 MG tablet Take 1 tablet (25 mg total) by mouth every 6 (six) hours as needed for anxiety. 12/11/17   Shaune Pollack, MD  naproxen sodium (ALEVE) 220 MG tablet Take 660 mg by mouth daily as needed (pain).    [provider]  ondansetron (ZOFRAN-ODT) 8 MG disintegrating tablet Take 1 tablet (8 mg total) by mouth every 8 (eight) hours as needed for nausea or vomiting. 06/13/22   Dione Booze, MD      Allergies    Ceclor [cefaclor] and Tylenol [acetaminophen]    Review of Systems    Review of Systems  Physical Exam Updated Vital Signs BP 133/87 (BP Location: Right Arm)   Pulse 93   Temp 98.2 F (36.8 C)   Resp 18   SpO2 95%  Physical Exam Constitutional:      General: He is not in acute distress.    Appearance: He is obese.  HENT:     Head: Normocephalic and atraumatic.  Eyes:     Conjunctiva/sclera: Conjunctivae normal.     Pupils: Pupils are equal, round, and reactive to light.  Cardiovascular:     Rate and Rhythm: Normal rate and regular rhythm.  Pulmonary:     Effort: Pulmonary effort is normal. No respiratory distress.  Abdominal:     General: There is no distension.     Tenderness: There is no abdominal tenderness.  Genitourinary:    Penis: Normal.      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  Neurological:     General: No focal deficit present.     Mental Status: He is alert. Mental status is at baseline.  Psychiatric:        Mood and Affect: Mood normal.        Behavior:  Behavior normal.     ED Results / Procedures / Treatments   Labs (all labs ordered are listed, but only abnormal results are displayed) Labs Reviewed  URINALYSIS, ROUTINE W REFLEX MICROSCOPIC    EKG None  Radiology US SCROTUM W/DOPPLER  Result Date: 08/27/2022 CLINICAL DATA:  Left testicular pain and tenderness. EXAM: SCROTAL ULTRASOUND DOPPLER ULTRASOUND OF THE TESTICLES TECHNIQUE: Complete ultrasound examination of the testicles, epididymis, and other scrotal structures was performed. Color and spectral Doppler ultrasound were also utilized to evaluate blood flow to the testicles. COMPARISON:  Testicular ultrasound 10/22/2020 FINDINGS: Right testicle Measurements: 5.8 x 1.8 x 2.0 cm. No mass or microlithiasis visualized. Left testicle Measurements: 4.9 x 2.5 x 2.4 cm. No mass or  microlithiasis visualized. Right epididymis:  Normal in size and appearance. Left epididymis:  Normal in size and appearance. Hydrocele:  None visualized. Varicocele:  Bilateral varicoceles, left-greater-than-right. Pulsed Doppler interrogation of both testes demonstrates normal low resistance arterial and venous waveforms bilaterally. IMPRESSION: 1. No evidence for testicular torsion. 2. Bilateral varicoceles, left-greater-than-right. Electronically Signed   By: Annia Belt M.D.   On: 08/27/2022 14:30    Procedures Procedures    Medications Ordered in ED Medications  ketorolac (TORADOL) 30 MG/ML injection 30 mg (has no administration in time range)    ED Course/ Medical Decision Making/ A&P Clinical Course as of 08/27/22 1515  Wed Aug 27, 2022  1505 Signed out to dr Colan Neptune EDP [MT]    Clinical Course User Index [MT] Lam Bjorklund, Kermit Balo, MD                             Medical Decision Making Amount and/or Complexity of Data Reviewed Labs: ordered. Radiology: ordered.  Risk Prescription drug management.   Patient is here with left scrotal and inguinal discomfort for several days.  Differential would include epididymitis versus symptomatic varicocele versus inguinal hernia versus ureteral colic versus other.  Low suspicion for acute testicular torsion on exam.  Patient appears quite comfortable, no overt swelling of the testicle.  Low suspicion for infection or cellulitis.  Scrotal ultrasound personally viewed interpreted noting bilateral varicoceles without any other suspicious findings.  Patient is pending CT scan of the abdomen pelvis to evaluate for hernia versus ureteral stone.        Final Clinical Impression(s) / ED Diagnoses Final diagnoses:  None    Rx / DC Orders ED Discharge Orders     None         Terald Sleeper, MD 08/27/22 [phone]

## 2022-08-27 NOTE — Discharge Instructions (Addendum)
Recommend you wear supportive underwear/undergarments  Follow up with urology in regards to varicocele bilateral   It was a pleasure caring for you today in the emergency department.  Please return to the emergency department for any worsening or worrisome symptoms.

## 2022-08-29 ENCOUNTER — Ambulatory Visit (HOSPITAL_COMMUNITY): Payer: Self-pay | Attending: Pulmonary Disease

## 2022-09-05 ENCOUNTER — Ambulatory Visit: Payer: Self-pay | Admitting: Nurse Practitioner

## 2023-04-01 ENCOUNTER — Other Ambulatory Visit: Payer: Self-pay

## 2023-04-01 ENCOUNTER — Emergency Department (HOSPITAL_COMMUNITY)
Admission: EM | Admit: 2023-04-01 | Discharge: 2023-04-02 | Disposition: A | Discharge status: Home / Self Care | Payer: Self-pay | Attending: Emergency Medicine | Admitting: Emergency Medicine

## 2023-04-01 ENCOUNTER — Emergency Department (HOSPITAL_COMMUNITY): Payer: Self-pay

## 2023-04-01 ENCOUNTER — Encounter (HOSPITAL_COMMUNITY): Payer: Self-pay

## 2023-04-01 DIAGNOSIS — R197 Diarrhea, unspecified: Secondary | ICD-10-CM | POA: Insufficient documentation

## 2023-04-01 DIAGNOSIS — R1012 Left upper quadrant pain: Secondary | ICD-10-CM | POA: Insufficient documentation

## 2023-04-01 DIAGNOSIS — R0602 Shortness of breath: Secondary | ICD-10-CM | POA: Insufficient documentation

## 2023-04-01 DIAGNOSIS — R110 Nausea: Secondary | ICD-10-CM | POA: Insufficient documentation

## 2023-04-01 LAB — LIPASE, BLOOD: Lipase: 25 U/L (ref 11–51)

## 2023-04-01 LAB — CBC
HCT: 40.3 % (ref 39.0–52.0)
Hemoglobin: 13.3 g/dL (ref 13.0–17.0)
MCH: 27.3 pg (ref 26.0–34.0)
MCHC: 33 g/dL (ref 30.0–36.0)
MCV: 82.8 fL (ref 80.0–100.0)
Platelets: 327 10*3/uL (ref 150–400)
RBC: 4.87 MIL/uL (ref 4.22–5.81)
RDW: 13.9 % (ref 11.5–15.5)
WBC: 7.9 10*3/uL (ref 4.0–10.5)
nRBC: 0 % (ref 0.0–0.2)

## 2023-04-01 LAB — COMPREHENSIVE METABOLIC PANEL
ALT: 32 U/L (ref 0–44)
AST: 22 U/L (ref 15–41)
Albumin: 4.2 g/dL (ref 3.5–5.0)
Alkaline Phosphatase: 86 U/L (ref 38–126)
Anion gap: 13 (ref 5–15)
BUN: 11 mg/dL (ref 6–20)
CO2: 23 mmol/L (ref 22–32)
Calcium: 9.1 mg/dL (ref 8.9–10.3)
Chloride: 103 mmol/L (ref 98–111)
Creatinine, Ser: 0.91 mg/dL (ref 0.61–1.24)
GFR, Estimated: >60 mL/min (ref >60)
Glucose, Bld: 101 mg/dL — ABNORMAL HIGH (ref 70–99)
Potassium: 4.1 mmol/L (ref 3.5–5.1)
Sodium: 139 mmol/L (ref 135–145)
Total Bilirubin: 0.8 mg/dL (ref 0.0–1.2)
Total Protein: 7.1 g/dL (ref 6.5–8.1)

## 2023-04-01 MED ORDER — ALUM & MAG HYDROXIDE-SIMETH 200-200-20 MG/5ML PO SUSP
30.0000 mL | Freq: Once | ORAL | Status: AC
  Administered 2023-04-02: 30 mL via ORAL
  Filled 2023-04-01: qty 30

## 2023-04-01 NOTE — ED Provider Notes (Signed)
 [place] EMERGENCY DEPARTMENT AT [facility] Provider Note   CSN: [id number] Arrival date & time: 04/01/23  2059     History {Add pertinent medical, surgical, social history, OB history to HPI:1} Chief Complaint  Patient presents with   Abdominal Pain    Aaron Wheeler is a 33 y.o. male.  The history is provided by the patient.  Patient with history of super morbid obesity presents with upper abdominal pain that is worse with eating.  Patient reports this started around 5 days ago, has been constant since then.  Its mostly in the left upper quadrant and moves to his left flank.  It is exacerbated by eating.  At times the pain is in his chest.  No fevers or vomiting.  He reports mild shortness of breath.  He reports nausea.  He is also had nonbloody diarrhea.  No previous abdominal surgeries.  Denies any NSAID or alcohol abuse.  He has tried home Pepcid and Tums without relief Denies any bloody or dark stools. Denies any urinary symptoms    Past Medical History:  Diagnosis Date   Thyroid disease    hyperthyroidsm    Home Medications Prior to Admission medications   Not on File      Allergies    Ceclor [cefaclor] and Tylenol [acetaminophen]    Review of Systems   Review of Systems  Constitutional:  Negative for fever.  Gastrointestinal:  Positive for diarrhea and nausea. Negative for blood in stool and vomiting.  Genitourinary:  Negative for dysuria.    Physical Exam Updated Vital Signs BP (!) 142/94 (BP Location: Right Wrist)   Pulse (!) 104   Temp 98.6 F (37 C) (Oral)   Resp (!) 24   Ht 1.702 m (5\' 7" )   Wt (!) 188.2 kg   SpO2 100%   BMI 65.00 kg/m  Physical Exam CONSTITUTIONAL: Well developed/well nourished HEAD: Normocephalic/atraumatic EYES: EOMI/PERRL, no icterus ENMT: Mucous membranes moist NECK: supple no meningeal signs SPINE/BACK:entire spine nontender CV: S1/S2 noted, no murmurs/rubs/gallops noted LUNGS: Lungs are clear to  auscultation bilaterally, no apparent distress ABDOMEN: soft, mild left upper quadrant tenderness but limited due to body size, no rebound or guarding, bowel sounds noted throughout abdomen GU:no cva tenderness NEURO: Pt is awake/alert/appropriate, moves all extremitiesx4.  No facial droop.   EXTREMITIES: pulses normal/equal, full ROM SKIN: warm, color normal, there is no rash to his chest abdomen or flank PSYCH: no abnormalities of mood noted, alert and oriented to situation  ED Results / Procedures / Treatments   Labs (all labs ordered are listed, but only abnormal results are displayed) Labs Reviewed  COMPREHENSIVE METABOLIC PANEL - Abnormal; Notable for the following components:      Result Value   Glucose, Bld 101 (*)    All other components within normal limits  LIPASE, BLOOD  CBC  URINALYSIS, ROUTINE W REFLEX MICROSCOPIC  TROPONIN I (HIGH SENSITIVITY)    EKG None  Radiology No results found.  Procedures Procedures  {Document cardiac monitor, telemetry assessment procedure when appropriate:1}  Medications Ordered in ED Medications  alum & mag hydroxide-simeth (MAALOX/MYLANTA) 200-200-20 MG/5ML suspension 30 mL (has no administration in time range)    ED Course/ Medical Decision Making/ A&P   {   Click here for ABCD2, HEART and other calculatorsREFRESH Note before signing :1}  Medical Decision Making Amount and/or Complexity of Data Reviewed Labs: ordered. Radiology: ordered. ECG/medicine tests: ordered.  Risk OTC drugs.   This patient presents to the ED for concern of abdominal pain, this involves an extensive number of treatment options, and is a complaint that carries with it a high risk of complications and morbidity.  The differential diagnosis includes but is not limited to cholecystitis, cholelithiasis, pancreatitis, gastritis, peptic ulcer disease, appendicitis, bowel obstruction, bowel perforation, diverticulitis, AAA,  ischemic bowel    Comorbidities that complicate the patient evaluation: Patient's presentation is complicated by their history of obesity  Social Determinants of Health: Patient's  tobacco use and limited primary care evaluations   increases the complexity of managing their presentation  Additional history obtained: Records reviewed  pulmonology notes reviewed  Lab Tests: I Ordered, and personally interpreted labs.  The pertinent results include:  ***  Imaging Studies ordered: I ordered imaging studies including X-ray chest   I independently visualized and interpreted imaging which showed *** I agree with the radiologist interpretation  Cardiac Monitoring: The patient was maintained on a cardiac monitor.  I personally viewed and interpreted the cardiac monitor which showed an underlying rhythm of:  {cardiac monitor:26849}  Medicines ordered and prescription drug management: I ordered medication including GI cocktail for pain Reevaluation of the patient after these medicines showed that the patient    {resolved/improved/worsened:23923::"improved"}  Test Considered: Patient is low risk / negative by ***, therefore do not feel that *** is indicated.  Critical Interventions:  ***  Consultations Obtained: I requested consultation with the {consultation:26851}, and discussed  findings as well as pertinent plan - they recommend: ***  Reevaluation: After the interventions noted above, I reevaluated the patient and found that they have :{resolved/improved/worsened:23923::"improved"}  Complexity of problems addressed: Patient's presentation is most consistent with  {ZOXW:96045}  Disposition: After consideration of the diagnostic results and the patient's response to treatment,  I feel that the patent would benefit from {disposition:26850}.     {Document critical care time when appropriate:1} {Document review of labs and clinical decision tools ie heart score, Chads2Vasc2  etc:1}  {Document your independent review of radiology images, and any outside records:1} {Document your discussion with family members, caretakers, and with consultants:1} {Document social determinants of health affecting pt's care:1} {Document your decision making why or why not admission, treatments were needed:1} Final Clinical Impression(s) / ED Diagnoses Final diagnoses:  None    Rx / DC Orders ED Discharge Orders     None

## 2023-04-01 NOTE — ED Triage Notes (Signed)
 Complaining of pain in the upper left side of abdomen that happens when he eats. Said that greasy foods make it worse. Aches all the time though even when he does not eat.

## 2023-04-02 LAB — URINALYSIS, ROUTINE W REFLEX MICROSCOPIC
Bilirubin Urine: NEGATIVE
Glucose, UA: NEGATIVE mg/dL
Hgb urine dipstick: NEGATIVE
Ketones, ur: 5 mg/dL — AB
Leukocytes,Ua: NEGATIVE
Nitrite: NEGATIVE
Protein, ur: NEGATIVE mg/dL
Specific Gravity, Urine: 1.023 (ref 1.005–1.030)
pH: 6 (ref 5.0–8.0)

## 2023-04-02 LAB — TROPONIN I (HIGH SENSITIVITY): Troponin I (High Sensitivity): <2 ng/L (ref <18)

## 2023-04-02 MED ORDER — PANTOPRAZOLE SODIUM 40 MG PO TBEC: 40.0000 mg | DELAYED_RELEASE_TABLET | Freq: Every day | ORAL | 0 refills | Status: DC

## 2023-04-22 IMAGING — US US SCROTUM W/ DOPPLER COMPLETE
1 series · 14 of 25 positions shown · non-contrast
Comparison: None.

CLINICAL DATA: Left scrotal pain. History of prior surgery to
correct cryptorchidism at age 3.

EXAM:
SCROTAL ULTRASOUND
DOPPLER ULTRASOUND OF THE TESTICLES
TECHNIQUE: Complete ultrasound examination of the testicles, epididymis, and
other scrotal structures was performed. Color and spectral Doppler
ultrasound were also utilized to evaluate blood flow to the
testicles.

[Series 1: us scrotum w/doppler · 63 acquisitions, 14 frames shown]
[im 1/63]
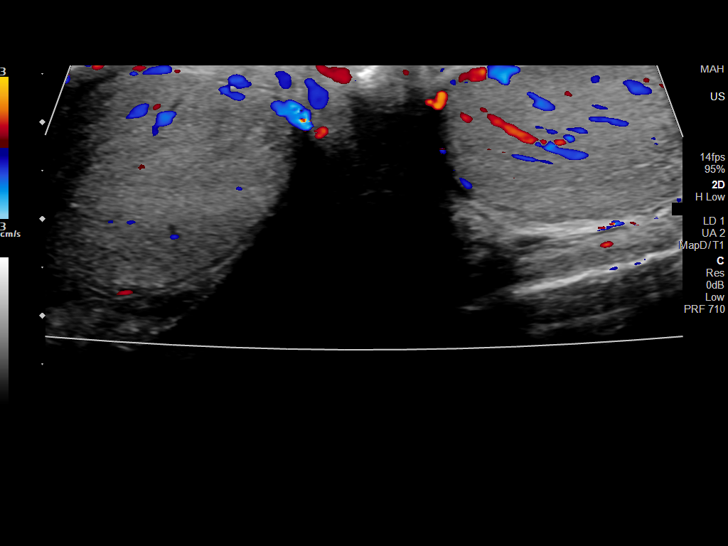
[im 6/63]
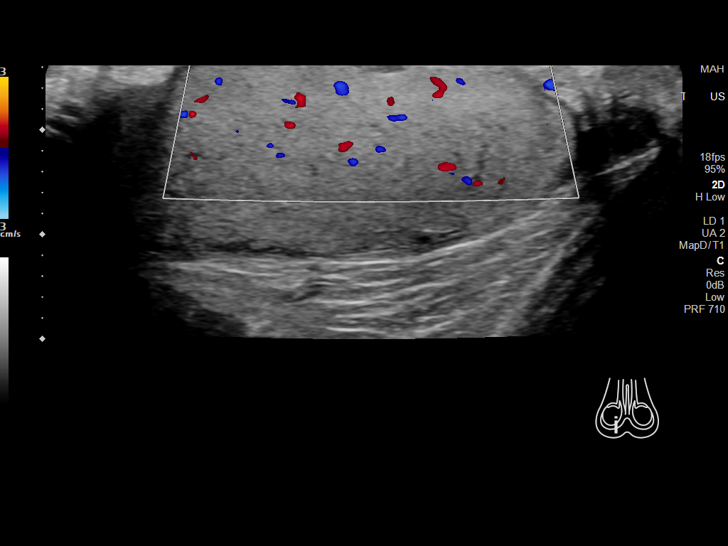
[im 11/63]
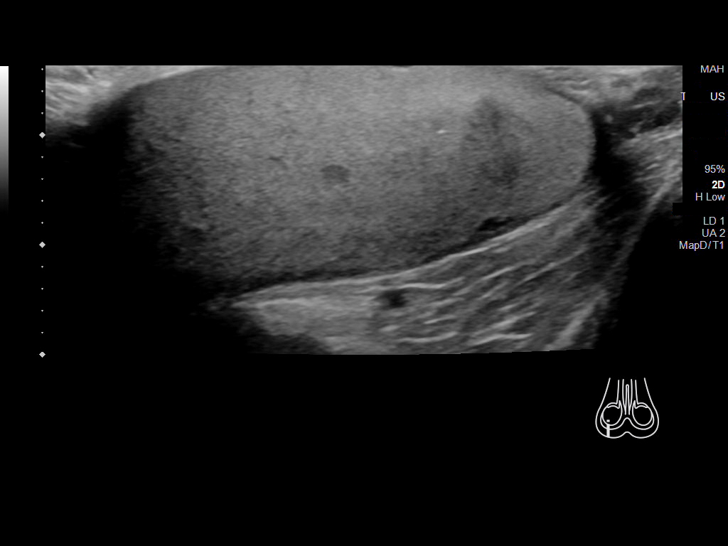
[im 16/63]
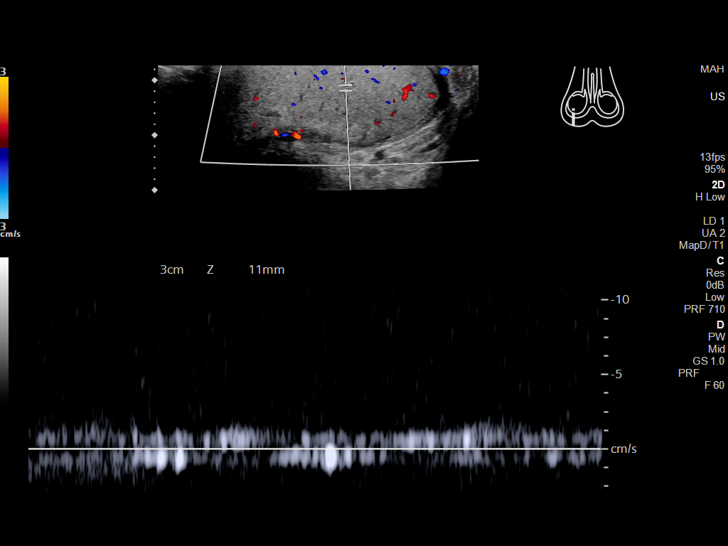
[im 21/63]
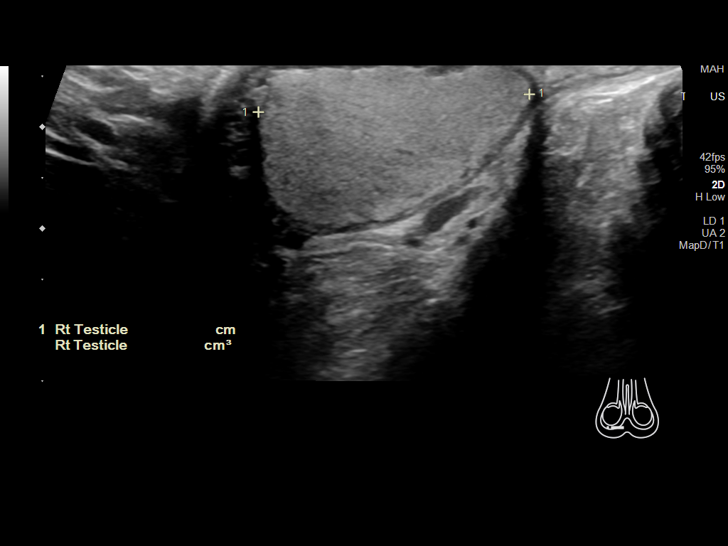
[im 24/63]
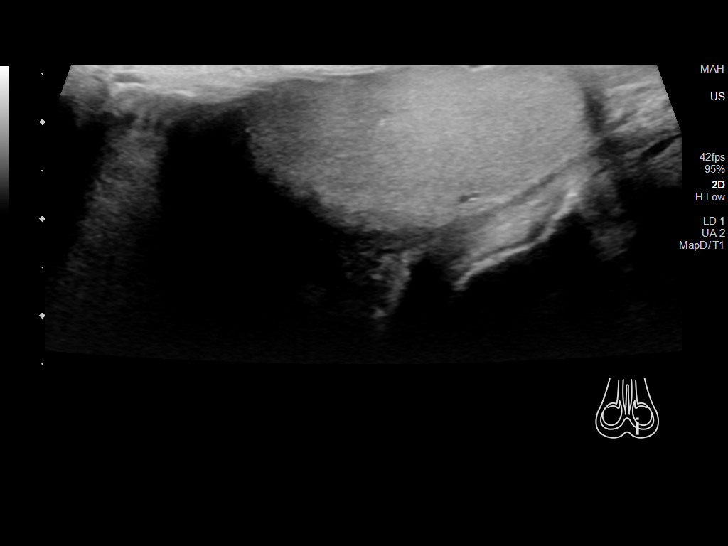
[im 29/63]
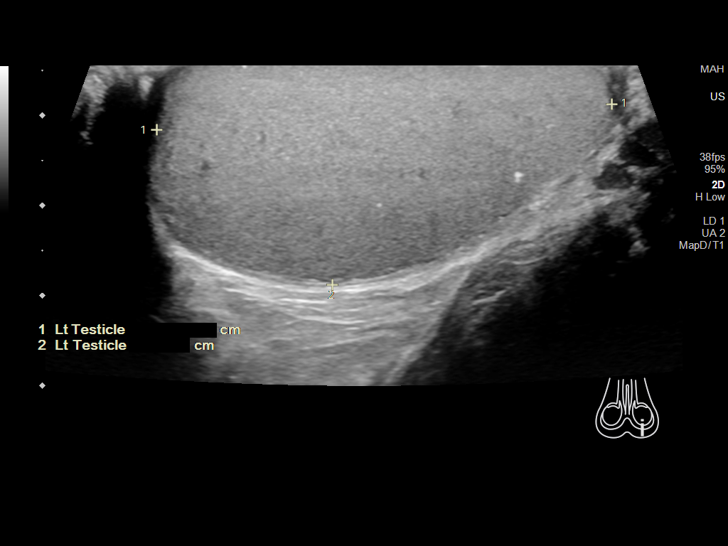
[im 34/63]
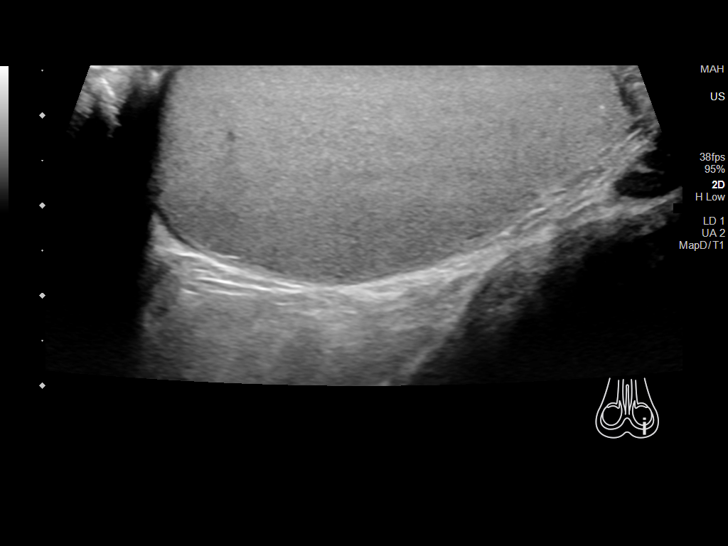
[im 39/63]
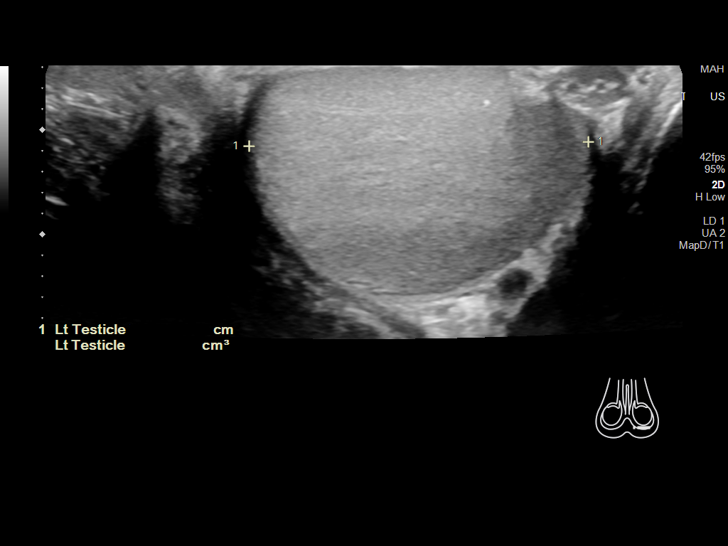
[im 42/63]
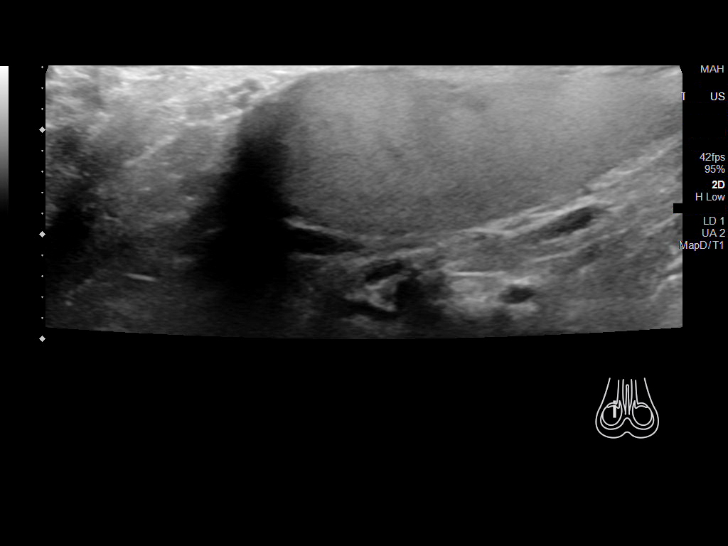
[im 47/63]
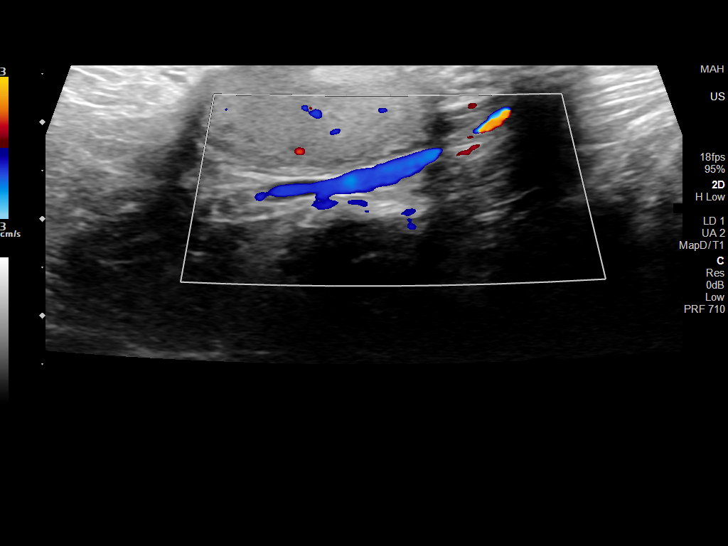
[im 52/63]
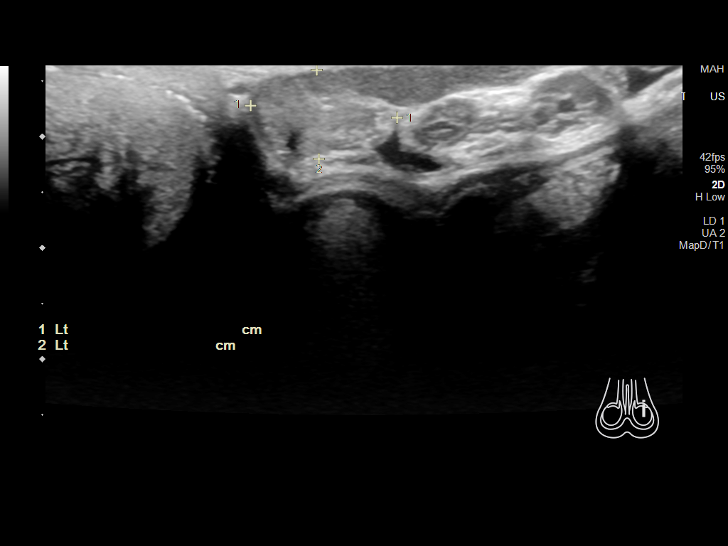
[im 57/63]
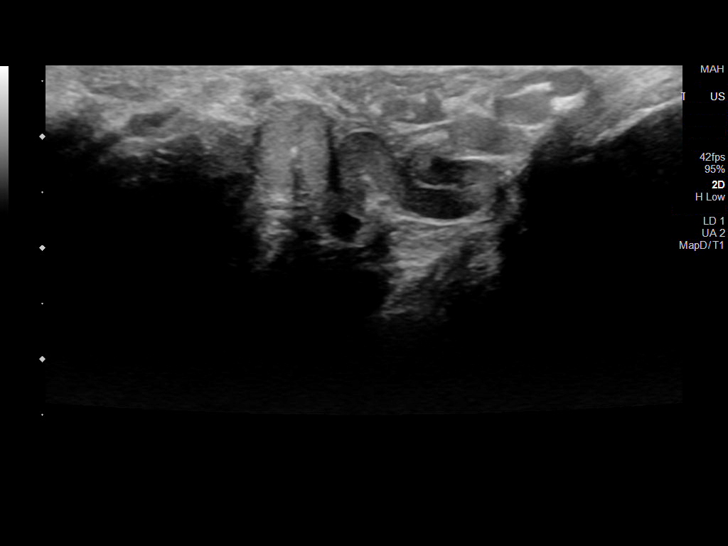
[im 63/63]
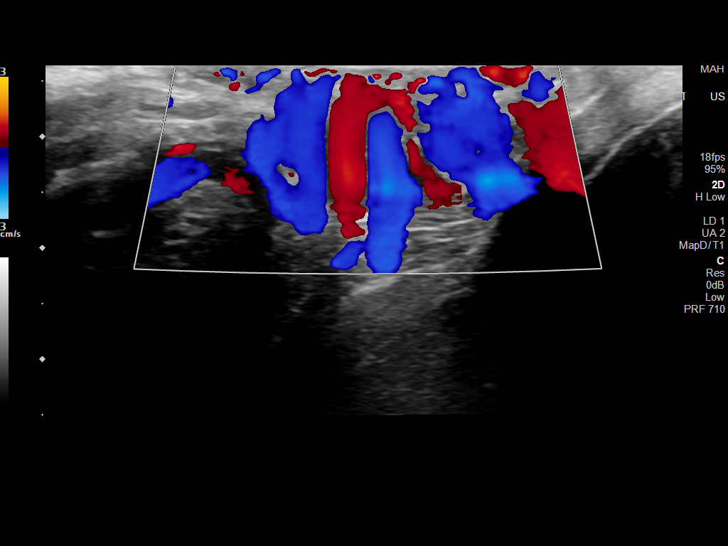

[14 of 25 positions shown; findings below may reference images not displayed]

FINDINGS: Right testicle

Measurements: 4.7 x 2.0 x 2.7 cm. No mass visualized. Solitary
microcalcification considered incidental.

Left testicle

Measurements: 5.1 x 2.5 x 3.3 cm. No mass or microlithiasis
visualized.

Right epididymis:  Normal in size and appearance.

Left epididymis:  Normal in size and appearance.

Hydrocele:  None visualized.

Varicocele:  Positive for left-sided varicocele.

Pulsed Doppler interrogation of both testes demonstrates normal low
resistance arterial and venous waveforms bilaterally.
IMPRESSION: 1. No evidence of testicular torsion at this time.
2. Positive for left-sided varicocele. This may represent the source
of the patient's left-sided testicular pain.
3. No evidence of testicular mass or significant abnormality.

## 2023-06-04 ENCOUNTER — Emergency Department (HOSPITAL_COMMUNITY): Payer: Self-pay

## 2023-06-04 ENCOUNTER — Encounter (HOSPITAL_COMMUNITY): Payer: Self-pay

## 2023-06-04 ENCOUNTER — Other Ambulatory Visit: Payer: Self-pay

## 2023-06-04 ENCOUNTER — Emergency Department (HOSPITAL_COMMUNITY)
Admission: EM | Admit: 2023-06-04 | Discharge: 2023-06-04 | Disposition: A | Discharge status: Home / Self Care | Payer: Self-pay | Attending: Emergency Medicine | Admitting: Emergency Medicine

## 2023-06-04 DIAGNOSIS — R070 Pain in throat: Secondary | ICD-10-CM | POA: Insufficient documentation

## 2023-06-04 DIAGNOSIS — R0602 Shortness of breath: Secondary | ICD-10-CM | POA: Insufficient documentation

## 2023-06-04 DIAGNOSIS — R0989 Other specified symptoms and signs involving the circulatory and respiratory systems: Secondary | ICD-10-CM

## 2023-06-04 DIAGNOSIS — M542 Cervicalgia: Secondary | ICD-10-CM | POA: Insufficient documentation

## 2023-06-04 LAB — CBC
HCT: 42.1 % (ref 39.0–52.0)
Hemoglobin: 14 g/dL (ref 13.0–17.0)
MCH: 27.9 pg (ref 26.0–34.0)
MCHC: 33.3 g/dL (ref 30.0–36.0)
MCV: 83.9 fL (ref 80.0–100.0)
Platelets: 364 10*3/uL (ref 150–400)
RBC: 5.02 MIL/uL (ref 4.22–5.81)
RDW: 13.8 % (ref 11.5–15.5)
WBC: 7.3 10*3/uL (ref 4.0–10.5)
nRBC: 0 % (ref 0.0–0.2)

## 2023-06-04 LAB — BASIC METABOLIC PANEL WITH GFR
Anion gap: 9 (ref 5–15)
BUN: 12 mg/dL (ref 6–20)
CO2: 25 mmol/L (ref 22–32)
Calcium: 9.9 mg/dL (ref 8.9–10.3)
Chloride: 104 mmol/L (ref 98–111)
Creatinine, Ser: 0.93 mg/dL (ref 0.61–1.24)
GFR, Estimated: >60 mL/min (ref >60)
Glucose, Bld: 104 mg/dL — ABNORMAL HIGH (ref 70–99)
Potassium: 4.1 mmol/L (ref 3.5–5.1)
Sodium: 138 mmol/L (ref 135–145)

## 2023-06-04 LAB — TROPONIN I (HIGH SENSITIVITY): Troponin I (High Sensitivity): 2 ng/L (ref <18)

## 2023-06-04 MED ORDER — ALBUTEROL SULFATE HFA 108 (90 BASE) MCG/ACT IN AERS
2.0000 | INHALATION_SPRAY | Freq: Once | RESPIRATORY_TRACT | Status: AC
  Administered 2023-06-04: 2 via RESPIRATORY_TRACT
  Filled 2023-06-04: qty 6.7

## 2023-06-04 MED ORDER — PANTOPRAZOLE SODIUM 40 MG PO TBEC: 40.0000 mg | DELAYED_RELEASE_TABLET | Freq: Every day | ORAL | 0 refills | Status: AC

## 2023-06-04 NOTE — ED Triage Notes (Addendum)
 Pt presents with episodes of shortness of breath after his throat gets "tight" and his starts hurting, lasting around 1 hour. Pt states this has happened twice today. States he is having panic attacks due to this. No other complaints.   Able to speak full sentences and does not appear to be in any distress.

## 2023-06-04 NOTE — Discharge Instructions (Signed)
 You are seen in the emergency department for some tightness in your throat shortness of breath chest discomfort.  You had blood work EKG chest x-ray that did not show an obvious explanation for your symptoms.  Please try the albuterol  inhaler 2 puffs every 4-6 hours as needed.  Also use acid medication such as Protonix , prescription sent to the pharmacy.  Follow-up with your regular doctor.  Return to the emergency department if any worsening or concerning symptoms

## 2023-06-04 NOTE — ED Provider Notes (Signed)
 [facility] EMERGENCY DEPARTMENT AT [facility] Provider Note   CSN: [id number] Arrival date & time: 06/04/23  2021     History  Chief Complaint  Patient presents with   Shortness of Breath    Aaron Wheeler is a 33 y.o. male.  He is here with 2 episodes of tightness in his throat that extended across to his chest.  Aaron Wheeler he felt like he could not breathe.  Occurred while he was going outside.  Aaron Wheeler it felt a little bit like asthma when he was a kid.  He does vape.  No reflux symptoms.  No diaphoresis.  He said when it happened caused a panic attack which she has had in the past.  No cough or hemoptysis.  No fevers or chills.  The history is provided by the patient.  Shortness of Breath Severity:  Moderate Onset quality:  Sudden Duration:  1 day Timing:  Sporadic Progression:  Resolved Chronicity:  New Relieved by:  None tried Associated symptoms: chest pain and neck pain   Associated symptoms: no abdominal pain, no cough, no fever, no sore throat, no sputum production and no wheezing   Risk factors: tobacco use        Home Medications Prior to Admission medications   Medication Sig Start Date End Date Taking? Authorizing Provider  pantoprazole  (PROTONIX ) 40 MG tablet Take 1 tablet (40 mg total) by mouth daily. 04/02/23   Eldon Greenland, MD      Allergies    Ceclor [cefaclor] and Tylenol  [acetaminophen ]    Review of Systems   Review of Systems  Constitutional:  Negative for fever.  HENT:  Negative for sore throat.   Respiratory:  Positive for shortness of breath. Negative for cough, sputum production and wheezing.   Cardiovascular:  Positive for chest pain.  Gastrointestinal:  Negative for abdominal pain.  Musculoskeletal:  Positive for neck pain.    Physical Exam Updated Vital Signs BP (!) 151/91 (BP Location: Right Arm)   Pulse 72   Temp 98.1 F (36.7 C) (Oral)   Resp 20   SpO2 96%  Physical Exam Vitals and nursing note reviewed.   Constitutional:      General: He is not in acute distress.    Appearance: He is well-developed. He is obese.  HENT:     Head: Normocephalic and atraumatic.  Eyes:     Conjunctiva/sclera: Conjunctivae normal.  Cardiovascular:     Rate and Rhythm: Normal rate and regular rhythm.     Heart sounds: No murmur heard. Pulmonary:     Effort: Pulmonary effort is normal. No respiratory distress.     Breath sounds: Normal breath sounds.  Abdominal:     Palpations: Abdomen is soft.     Tenderness: There is no abdominal tenderness.  Musculoskeletal:        General: No swelling.     Cervical back: Neck supple.     Right lower leg: No edema.     Left lower leg: No edema.  Skin:    General: Skin is warm and dry.     Capillary Refill: Capillary refill takes less than 2 seconds.  Neurological:     General: No focal deficit present.     Mental Status: He is alert.     ED Results / Procedures / Treatments   Labs (all labs ordered are listed, but only abnormal results are displayed) Labs Reviewed  BASIC METABOLIC PANEL WITH GFR - Abnormal; Notable for the following components:  Result Value   Glucose, Bld 104 (*)    All other components within normal limits  CBC  TROPONIN I (HIGH SENSITIVITY)    EKG EKG Interpretation Date/Time:  Thursday Jun 04 2023 20:34:05 EDT Ventricular Rate:  72 PR Interval:  150 QRS Duration:  88 QT Interval:  358 QTC Calculation: 392 R Axis:   51  Text Interpretation: Normal sinus rhythm Normal ECG When compared with ECG of 02-Apr-2023 00:39, No significant change since last tracing Confirmed by Racheal Buddle [phone]) on 06/04/2023 8:35:53 PM  Radiology DG Chest 2 View Result Date: 06/04/2023 CLINICAL DATA:  Short of breath, throat tightness EXAM: CHEST - 2 VIEW COMPARISON:  04/01/2023 FINDINGS: Frontal and lateral views of the chest demonstrate an unremarkable cardiac silhouette. No acute airspace disease, effusion, or pneumothorax. No acute bony  abnormalities. IMPRESSION: 1. No acute intrathoracic process. Electronically Signed   By: Bobbye Burrow M.D.   On: 06/04/2023 20:52    Procedures Procedures    Medications Ordered in ED Medications  albuterol  (VENTOLIN  HFA) 108 (90 Base) MCG/ACT inhaler 2 puff (has no administration in time range)    ED Course/ Medical Decision Making/ A&P                                 Medical Decision Making Amount and/or Complexity of Data Reviewed Labs: ordered. Radiology: ordered.  Risk Prescription drug management.   This patient complains of tightness and throat tightness in chest trouble breathing; this involves an extensive number of treatment Options and is a complaint that carries with it a high risk of complications and morbidity. The differential includes allergic reaction, asthma, reflux, ACS  I ordered, reviewed and interpreted labs, which included CBC chemistries troponin normal I ordered medication breathing treatment and reviewed PMP when indicated. I ordered imaging studies which included chest x-ray and I independently    visualized and interpreted imaging which showed no acute findings Previous records obtained and reviewed no recent admissions Cardiac monitoring reviewed, normal sinus rhythm Social determinants considered, tobacco use Critical Interventions: None  After the interventions stated above, I reevaluated the patient and found patient to be satting well on room air in no distress Admission and further testing considered, no indications for admission.  Will trial on albuterol  inhaler and recommended close follow-up with PCP.  Return instructions discussed         Final Clinical Impression(s) / ED Diagnoses Final diagnoses:  Shortness of breath  Throat tightness    Rx / DC Orders ED Discharge Orders          Ordered    pantoprazole  (PROTONIX ) 40 MG tablet  Daily        06/04/23 2247              Tonya Fredrickson, MD 06/05/23  [phone]

## 2023-06-30 ENCOUNTER — Emergency Department (HOSPITAL_COMMUNITY): Payer: Self-pay

## 2023-06-30 ENCOUNTER — Other Ambulatory Visit: Payer: Self-pay

## 2023-06-30 ENCOUNTER — Emergency Department (HOSPITAL_COMMUNITY)
Admission: EM | Admit: 2023-06-30 | Discharge: 2023-06-30 | Disposition: A | Discharge status: Home / Self Care | Payer: Self-pay | Attending: Emergency Medicine | Admitting: Emergency Medicine

## 2023-06-30 ENCOUNTER — Encounter (HOSPITAL_COMMUNITY): Payer: Self-pay | Admitting: *Deleted

## 2023-06-30 DIAGNOSIS — N200 Calculus of kidney: Secondary | ICD-10-CM

## 2023-06-30 DIAGNOSIS — N132 Hydronephrosis with renal and ureteral calculous obstruction: Secondary | ICD-10-CM | POA: Insufficient documentation

## 2023-06-30 LAB — CBC WITH DIFFERENTIAL/PLATELET
Abs Immature Granulocytes: 0.06 10*3/uL (ref 0.00–0.07)
Basophils Absolute: 0.1 10*3/uL (ref 0.0–0.1)
Basophils Relative: 1 %
Eosinophils Absolute: 0.2 10*3/uL (ref 0.0–0.5)
Eosinophils Relative: 2 %
HCT: 41 % (ref 39.0–52.0)
Hemoglobin: 14.1 g/dL (ref 13.0–17.0)
Immature Granulocytes: 1 %
Lymphocytes Relative: 29 %
Lymphs Abs: 2.2 10*3/uL (ref 0.7–4.0)
MCH: 28.1 pg (ref 26.0–34.0)
MCHC: 34.4 g/dL (ref 30.0–36.0)
MCV: 81.7 fL (ref 80.0–100.0)
Monocytes Absolute: 0.6 10*3/uL (ref 0.1–1.0)
Monocytes Relative: 8 %
Neutro Abs: 4.7 10*3/uL (ref 1.7–7.7)
Neutrophils Relative %: 59 %
Platelets: 323 10*3/uL (ref 150–400)
RBC: 5.02 MIL/uL (ref 4.22–5.81)
RDW: 13.9 % (ref 11.5–15.5)
WBC: 7.8 10*3/uL (ref 4.0–10.5)
nRBC: 0 % (ref 0.0–0.2)

## 2023-06-30 LAB — BASIC METABOLIC PANEL WITH GFR
Anion gap: 12 (ref 5–15)
BUN: 13 mg/dL (ref 6–20)
CO2: 21 mmol/L — ABNORMAL LOW (ref 22–32)
Calcium: 9.1 mg/dL (ref 8.9–10.3)
Chloride: 107 mmol/L (ref 98–111)
Creatinine, Ser: 1.05 mg/dL (ref 0.61–1.24)
GFR, Estimated: >60 mL/min (ref >60)
Glucose, Bld: 130 mg/dL — ABNORMAL HIGH (ref 70–99)
Potassium: 3.3 mmol/L — ABNORMAL LOW (ref 3.5–5.1)
Sodium: 140 mmol/L (ref 135–145)

## 2023-06-30 LAB — URINALYSIS, W/ REFLEX TO CULTURE (INFECTION SUSPECTED)
Bacteria, UA: NONE SEEN
Bilirubin Urine: NEGATIVE
Glucose, UA: NEGATIVE mg/dL
Ketones, ur: NEGATIVE mg/dL
Leukocytes,Ua: NEGATIVE
Nitrite: NEGATIVE
Protein, ur: 30 mg/dL — AB
Specific Gravity, Urine: 1.029 (ref 1.005–1.030)
pH: 5 (ref 5.0–8.0)

## 2023-06-30 MED ORDER — OXYCODONE HCL 5 MG PO TABS: 5.0000 mg | ORAL_TABLET | Freq: Four times a day (QID) | ORAL | 0 refills | Status: AC | PRN

## 2023-06-30 MED ORDER — FENTANYL CITRATE PF 50 MCG/ML IJ SOSY
50.0000 ug | PREFILLED_SYRINGE | Freq: Once | INTRAMUSCULAR | Status: AC
  Administered 2023-06-30: 50 ug via INTRAVENOUS
  Filled 2023-06-30: qty 1

## 2023-06-30 MED ORDER — KETOROLAC TROMETHAMINE 30 MG/ML IJ SOLN
30.0000 mg | Freq: Once | INTRAMUSCULAR | Status: AC
  Administered 2023-06-30: 30 mg via INTRAVENOUS
  Filled 2023-06-30: qty 1

## 2023-06-30 MED ORDER — ONDANSETRON HCL 4 MG/2ML IJ SOLN
4.0000 mg | Freq: Once | INTRAMUSCULAR | Status: AC
  Administered 2023-06-30: 4 mg via INTRAVENOUS
  Filled 2023-06-30: qty 2

## 2023-06-30 MED ORDER — ONDANSETRON 4 MG PO TBDP: 4.0000 mg | ORAL_TABLET | Freq: Three times a day (TID) | ORAL | 0 refills | Status: AC | PRN

## 2023-06-30 MED ORDER — HYDROMORPHONE HCL 1 MG/ML IJ SOLN
0.5000 mg | Freq: Once | INTRAMUSCULAR | Status: AC
  Administered 2023-06-30: 0.5 mg via INTRAVENOUS
  Filled 2023-06-30: qty 0.5

## 2023-06-30 NOTE — ED Provider Notes (Signed)
 [place] EMERGENCY DEPARTMENT AT [facility] Provider Note   CSN: [id number] Arrival date & time: 06/30/23  9563     History  Chief Complaint  Patient presents with   Flank Pain    Aaron Wheeler is a 33 y.o. male.   Flank Pain  Patient presents with left-sided flank pain.  Woke up at 2 in the morning.  States he has some burning in his penis.  Also pain in his left testicle.  States he has had problems with the "e part" of his testicle before.  States he had chills at home but does not know if he had a fever.  Standing up.  States the pain goes into his left back.  No injury.     Home Medications Prior to Admission medications   Medication Sig Start Date End Date Taking? Authorizing Provider  fluticasone (FLONASE) 50 MCG/ACT nasal spray Place 1 spray into both nostrils daily.   Yes [provider]  omeprazole (PRILOSEC) 20 MG capsule Take 20 mg by mouth daily.   Yes [provider]  ondansetron  (ZOFRAN -ODT) 4 MG disintegrating tablet Take 1 tablet (4 mg total) by mouth every 8 (eight) hours as needed for nausea or vomiting. 06/30/23  Yes Mozell Arias, MD  oxyCODONE  (ROXICODONE ) 5 MG immediate release tablet Take 1 tablet (5 mg total) by mouth every 6 (six) hours as needed for severe pain (pain score 7-10). 06/30/23  Yes Mozell Arias, MD  pantoprazole  (PROTONIX ) 40 MG tablet Take 1 tablet (40 mg total) by mouth daily. 06/04/23  Yes Tonya Fredrickson, MD  sertraline (ZOLOFT) 50 MG tablet Take 50 mg by mouth daily. 07/19/21  Yes [provider]      Allergies    Ceclor [cefaclor] and Tylenol  [acetaminophen ]    Review of Systems   Review of Systems  Genitourinary:  Positive for flank pain.    Physical Exam Updated Vital Signs BP (!) 146/76   Pulse 78   Temp 98.1 F (36.7 C) (Oral)   Resp 16   Ht 5\' 6"  (1.676 m)   Wt (!) 186 kg   SpO2 98%   BMI 66.18 kg/m  Physical Exam Constitutional:      Comments: Patient found  standing in the room bent over the trash can.  Vomitus in trash can.  Abdominal:     Comments: Obese.  Genitourinary:    Comments: Some tenderness of the left testicle but apparent normal lie.  No CVA tenderness. Skin:    General: Skin is warm.  Neurological:     Mental Status: He is alert.     ED Results / Procedures / Treatments   Labs (all labs ordered are listed, but only abnormal results are displayed) Labs Reviewed  BASIC METABOLIC PANEL WITH GFR - Abnormal; Notable for the following components:      Result Value   Potassium 3.3 (*)    CO2 21 (*)    Glucose, Bld 130 (*)    All other components within normal limits  URINALYSIS, W/ REFLEX TO CULTURE (INFECTION SUSPECTED) - Abnormal; Notable for the following components:   Hgb urine dipstick MODERATE (*)    Protein, ur 30 (*)    All other components within normal limits  CBC WITH DIFFERENTIAL/PLATELET    EKG None  Radiology CT Renal Stone Study Result Date: 06/30/2023 CLINICAL DATA:  Flank pain.  Concern for kidney stone. EXAM: CT ABDOMEN AND PELVIS WITHOUT CONTRAST TECHNIQUE: Multidetector CT imaging of the abdomen and  pelvis was performed following the standard protocol without IV contrast. RADIATION DOSE REDUCTION: This exam was performed according to the departmental dose-optimization program which includes automated exposure control, adjustment of the mA and/or kV according to patient size and/or use of iterative reconstruction technique. COMPARISON:  CT abdomen pelvis dated 08/27/2022. FINDINGS: Evaluation of this exam is limited in the absence of intravenous contrast. Lower chest: The visualized lung bases are clear. No intra-abdominal free air or free fluid. Hepatobiliary: Fatty liver. No biliary ductal dilatation. The gallbladder is unremarkable. Pancreas: Unremarkable. No pancreatic ductal dilatation or surrounding inflammatory changes. Spleen: Normal in size without focal abnormality. Adrenals/Urinary Tract: The  adrenal glands are unremarkable. There is a punctate stone in the distal left ureter with mild left hydronephrosis. The right kidney, right ureter appear unremarkable. The urinary bladder is collapsed. Stomach/Bowel: There is no bowel obstruction or active inflammation. The appendix is normal. Vascular/Lymphatic: The abdominal aorta and IVC unremarkable. No portal venous gas. There is no adenopathy. Reproductive: The prostate and seminal vesicles are grossly unremarkable. No pelvic mass. Other: None Musculoskeletal: No acute or significant osseous findings. IMPRESSION: 1. A punctate stone in the distal left ureter with mild left hydronephrosis. 2. Fatty liver. 3. No bowel obstruction. Normal appendix. Electronically Signed   By: Angus Bark M.D.   On: 06/30/2023 14:53   US  SCROTUM W/DOPPLER Result Date: 06/30/2023 CLINICAL DATA:  Flank and testicular pain, remote history of right orchiopexy EXAM: SCROTAL ULTRASOUND DOPPLER ULTRASOUND OF THE TESTICLES TECHNIQUE: Complete ultrasound examination of the testicles, epididymis, and other scrotal structures was performed. Color and spectral Doppler ultrasound were also utilized to evaluate blood flow to the testicles. COMPARISON:  08/27/2022 FINDINGS: Right testicle Measurements: 5.8 x 1.8 x 2.0 cm. No mass or microlithiasis visualized. Left testicle Measurements: 5.7 x 2.4 x 2.7 cm. No mass evident. There are a few scattered microcalcifications. Right epididymis:  Normal in size and appearance. Left epididymis:  Normal in size and appearance. Hydrocele:  None visualized. Varicocele:  None visualized. Pulsed Doppler interrogation of both testes demonstrates normal low resistance arterial and venous waveforms bilaterally. IMPRESSION: 1. No acute finding by ultrasound. 2. Left testicular microlithiasis. Electronically Signed   By: Melven Stable.  Shick M.D.   On: 06/30/2023 10:14    Procedures Procedures    Medications Ordered in ED Medications  ketorolac  (TORADOL ) 30  MG/ML injection 30 mg (30 mg Intravenous Given 06/30/23 1610)  fentaNYL (SUBLIMAZE) injection 50 mcg (50 mcg Intravenous Given 06/30/23 9604)  ondansetron  (ZOFRAN ) injection 4 mg (4 mg Intravenous Given 06/30/23 0711)  HYDROmorphone (DILAUDID) injection 0.5 mg (0.5 mg Intravenous Given 06/30/23 1051)    ED Course/ Medical Decision Making/ A&P                                 Medical Decision Making Amount and/or Complexity of Data Reviewed Radiology: ordered.  Risk Prescription drug management.   Patient\testicle pain.  Differential diagnose includes causes such as testicular torsion but also causes such as kidney stone.  Reviewed CT scan from July of last year and had no stones at that time.  Will get urinalysis basic blood work and treat symptomatically.  Will start with ultrasound to rule out emergent cause such as testicular torsion which would need urgent intervention.  Urine does show some hematuria.  Ultrasound showed no cause.  CT scan done due to flank pain.  Did find kidney stone.  Feeling better.  Will discharge  home to follow-up with urology.        Final Clinical Impression(s) / ED Diagnoses Final diagnoses:  Kidney stone    Rx / DC Orders ED Discharge Orders          Ordered    ondansetron  (ZOFRAN -ODT) 4 MG disintegrating tablet  Every 8 hours PRN        06/30/23 1502    oxyCODONE  (ROXICODONE ) 5 MG immediate release tablet  Every 6 hours PRN        06/30/23 1502              Mozell Arias, MD 06/30/23 1520

## 2023-06-30 NOTE — ED Triage Notes (Signed)
 Pt c/o left flank pain that woke him from sleep at 2am tonight; pt states he has a burning sensation at the tip of his penis

## 2023-06-30 NOTE — ED Notes (Signed)
 [place] at bedside

## 2023-06-30 NOTE — ED Notes (Signed)
AVS with prescriptions provided to and discussed with patient. Pt verbalizes understanding of discharge instructions and denies any questions or concerns at this time. Pt has ride home. Pt ambulated out of department independently with steady gait.

## 2023-06-30 NOTE — ED Notes (Signed)
 Pt provided with urinal. States unable to void at this time, but aware of need for sample. Pt also provided with gown and instructed to take off pants and underwear for ordered US . Verbalized understanding.

## 2023-08-21 ENCOUNTER — Emergency Department (HOSPITAL_COMMUNITY)
Admission: EM | Admit: 2023-08-21 | Discharge: 2023-08-21 | Disposition: A | Discharge status: Home / Self Care | Payer: Self-pay | Attending: Emergency Medicine | Admitting: Emergency Medicine

## 2023-08-21 ENCOUNTER — Encounter (HOSPITAL_COMMUNITY): Payer: Self-pay

## 2023-08-21 ENCOUNTER — Emergency Department (HOSPITAL_COMMUNITY): Payer: Self-pay

## 2023-08-21 DIAGNOSIS — R55 Syncope and collapse: Secondary | ICD-10-CM | POA: Insufficient documentation

## 2023-08-21 DIAGNOSIS — R519 Headache, unspecified: Secondary | ICD-10-CM | POA: Insufficient documentation

## 2023-08-21 DIAGNOSIS — R5383 Other fatigue: Secondary | ICD-10-CM | POA: Insufficient documentation

## 2023-08-21 DIAGNOSIS — E860 Dehydration: Secondary | ICD-10-CM | POA: Insufficient documentation

## 2023-08-21 HISTORY — DX: Panic disorder (episodic paroxysmal anxiety): F41.0

## 2023-08-21 LAB — CBC WITH DIFFERENTIAL/PLATELET
Abs Immature Granulocytes: 0.03 K/uL (ref 0.00–0.07)
Basophils Absolute: 0.1 K/uL (ref 0.0–0.1)
Basophils Relative: 1 %
Eosinophils Absolute: 0.1 K/uL (ref 0.0–0.5)
Eosinophils Relative: 1 %
HCT: 39.7 % (ref 39.0–52.0)
Hemoglobin: 13.3 g/dL (ref 13.0–17.0)
Immature Granulocytes: 0 %
Lymphocytes Relative: 23 %
Lymphs Abs: 1.7 K/uL (ref 0.7–4.0)
MCH: 28.7 pg (ref 26.0–34.0)
MCHC: 33.5 g/dL (ref 30.0–36.0)
MCV: 85.7 fL (ref 80.0–100.0)
Monocytes Absolute: 0.6 K/uL (ref 0.1–1.0)
Monocytes Relative: 8 %
Neutro Abs: 5.2 K/uL (ref 1.7–7.7)
Neutrophils Relative %: 67 %
Platelets: 314 K/uL (ref 150–400)
RBC: 4.63 MIL/uL (ref 4.22–5.81)
RDW: 14.1 % (ref 11.5–15.5)
WBC: 7.7 K/uL (ref 4.0–10.5)
nRBC: 0 % (ref 0.0–0.2)

## 2023-08-21 LAB — COMPREHENSIVE METABOLIC PANEL WITH GFR
ALT: 32 U/L (ref 0–44)
AST: 25 U/L (ref 15–41)
Albumin: 4.2 g/dL (ref 3.5–5.0)
Alkaline Phosphatase: 78 U/L (ref 38–126)
Anion gap: 12 (ref 5–15)
BUN: 13 mg/dL (ref 6–20)
CO2: 24 mmol/L (ref 22–32)
Calcium: 9.2 mg/dL (ref 8.9–10.3)
Chloride: 104 mmol/L (ref 98–111)
Creatinine, Ser: 0.99 mg/dL (ref 0.61–1.24)
GFR, Estimated: >60 mL/min (ref >60)
Glucose, Bld: 104 mg/dL — ABNORMAL HIGH (ref 70–99)
Potassium: 4.1 mmol/L (ref 3.5–5.1)
Sodium: 140 mmol/L (ref 135–145)
Total Bilirubin: 0.7 mg/dL (ref 0.0–1.2)
Total Protein: 7.3 g/dL (ref 6.5–8.1)

## 2023-08-21 LAB — CK: Total CK: 245 U/L (ref 49–397)

## 2023-08-21 LAB — TROPONIN I (HIGH SENSITIVITY)
Troponin I (High Sensitivity): 2 ng/L (ref <18)
Troponin I (High Sensitivity): 3 ng/L (ref <18)

## 2023-08-21 MED ORDER — SODIUM CHLORIDE 0.9 % IV BOLUS
1000.0000 mL | Freq: Once | INTRAVENOUS | Status: AC
  Administered 2023-08-21: 1000 mL via INTRAVENOUS

## 2023-08-21 NOTE — Discharge Instructions (Signed)
 Please follow-up closely with your primary care doctor on an outpatient basis.  Return to the emergency department immediately for any new or worsening symptoms.
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
## 2023-10-12 ENCOUNTER — Emergency Department (HOSPITAL_COMMUNITY)
Admission: EM | Admit: 2023-10-12 | Discharge: 2023-10-12 | Disposition: A | Discharge status: Home / Self Care | Payer: Self-pay | Attending: Emergency Medicine | Admitting: Emergency Medicine

## 2023-10-12 ENCOUNTER — Other Ambulatory Visit: Payer: Self-pay

## 2023-10-12 ENCOUNTER — Encounter (HOSPITAL_COMMUNITY): Payer: Self-pay

## 2023-10-12 DIAGNOSIS — H60332 Swimmer's ear, left ear: Secondary | ICD-10-CM

## 2023-10-12 DIAGNOSIS — H6092 Unspecified otitis externa, left ear: Secondary | ICD-10-CM | POA: Insufficient documentation

## 2023-10-12 MED ORDER — NEOMYCIN-POLYMYXIN-HC 3.5-10000-1 OT SUSP: 4.0000 [drp] | Freq: Four times a day (QID) | OTIC | 0 refills | Status: AC

## 2023-10-12 NOTE — ED Triage Notes (Signed)
 Pt c/o left ear pain, states he was seen at his PCP on Friday and given antibiotics for ear infection. Reports the pain has gotten worse, now having yellow-tinged drainage, swelling to left side of neck, jaw pain and headache.

## 2023-10-12 NOTE — Discharge Instructions (Addendum)
 Begin using Cortisporin drops, 4 drops in the left ear 4 times daily for the next 5 days.  Follow-up with primary doctor if not improving and return to the ER if symptoms worsen or change.

## 2023-10-12 NOTE — ED Provider Notes (Signed)
  [place] EMERGENCY DEPARTMENT AT [facility] Provider Note   CSN: [id number] Arrival date & time: 10/12/23  0446     Patient presents with: Otalgia   Aaron Wheeler is a 33 y.o. male.   Patient is a 33 year old male presenting with left ear pain.  This has been worsening over the past few days and started shortly after a vacation at the beach.  He was seen in urgent care and prescribed Zithromax for a middle ear infection, but does not seem to be helping.  He describes intermittent drainage from the ear and decreased hearing.  He also describes some swelling around and below the ear.       Prior to Admission medications   Medication Sig Start Date End Date Taking? Authorizing Provider  fluticasone (FLONASE) 50 MCG/ACT nasal spray Place 1 spray into both nostrils daily.    [provider]  omeprazole (PRILOSEC) 20 MG capsule Take 20 mg by mouth daily.    [provider]  ondansetron  (ZOFRAN -ODT) 4 MG disintegrating tablet Take 1 tablet (4 mg total) by mouth every 8 (eight) hours as needed for nausea or vomiting. 06/30/23   Patsey Lot, MD  oxyCODONE  (ROXICODONE ) 5 MG immediate release tablet Take 1 tablet (5 mg total) by mouth every 6 (six) hours as needed for severe pain (pain score 7-10). 06/30/23   Patsey Lot, MD  pantoprazole  (PROTONIX ) 40 MG tablet Take 1 tablet (40 mg total) by mouth daily. 06/04/23   Butler, Rama C, MD  sertraline (ZOLOFT) 50 MG tablet Take 50 mg by mouth daily. 07/19/21   [provider]    Allergies: Ceclor [cefaclor] and Tylenol  [acetaminophen ]    Review of Systems  All other systems reviewed and are negative.   Updated Vital Signs BP 133/81 (BP Location: Left Arm)   Pulse 83   Temp 98 F (36.7 C) (Oral)   Resp 20   SpO2 96%   Physical Exam Vitals and nursing note reviewed.  Constitutional:      Appearance: Normal appearance.  HENT:     Head: Normocephalic.     Ears:     Comments: The left  ear canal is erythematous and inflamed.  There is a slight serous drainage noted.  He has pain with manipulation of the outer ear and with speculum insertion.  Eardrum is minimally visible, but appears intact. Pulmonary:     Effort: Pulmonary effort is normal.  Skin:    General: Skin is warm and dry.  Neurological:     Mental Status: He is alert and oriented to person, place, and time.     (all labs ordered are listed, but only abnormal results are displayed) Labs Reviewed - No data to display  EKG: None  Radiology: No results found.   Procedures   Medications Ordered in the ED - No data to display                                  Medical Decision Making  This is an otitis externa.  To be treated with Cortisporin drops and follow-up as needed.     Final diagnoses:  None    ED Discharge Orders     None          Geroldine Berg, MD 10/12/23 [id number]

## 2023-10-31 ENCOUNTER — Emergency Department (HOSPITAL_BASED_OUTPATIENT_CLINIC_OR_DEPARTMENT_OTHER)
Admission: EM | Admit: 2023-10-31 | Discharge: 2023-11-01 | Disposition: A | Discharge status: Home / Self Care | Payer: Self-pay | Attending: Emergency Medicine | Admitting: Emergency Medicine

## 2023-10-31 ENCOUNTER — Other Ambulatory Visit: Payer: Self-pay

## 2023-10-31 ENCOUNTER — Emergency Department (HOSPITAL_BASED_OUTPATIENT_CLINIC_OR_DEPARTMENT_OTHER): Payer: Self-pay

## 2023-10-31 DIAGNOSIS — F419 Anxiety disorder, unspecified: Secondary | ICD-10-CM

## 2023-10-31 DIAGNOSIS — H538 Other visual disturbances: Secondary | ICD-10-CM | POA: Insufficient documentation

## 2023-10-31 DIAGNOSIS — R519 Headache, unspecified: Secondary | ICD-10-CM | POA: Insufficient documentation

## 2023-10-31 DIAGNOSIS — F410 Panic disorder [episodic paroxysmal anxiety] without agoraphobia: Secondary | ICD-10-CM | POA: Insufficient documentation

## 2023-10-31 DIAGNOSIS — H547 Unspecified visual loss: Secondary | ICD-10-CM

## 2023-10-31 LAB — CBG MONITORING, ED: Glucose-Capillary: 95 mg/dL (ref 70–99)

## 2023-10-31 LAB — TSH: TSH: 1.87 u[IU]/mL (ref 0.350–4.500)

## 2023-10-31 LAB — CBC WITH DIFFERENTIAL/PLATELET
Abs Immature Granulocytes: 0.07 K/uL (ref 0.00–0.07)
Basophils Absolute: 0.1 K/uL (ref 0.0–0.1)
Basophils Relative: 1 %
Eosinophils Absolute: 0.1 K/uL (ref 0.0–0.5)
Eosinophils Relative: 1 %
HCT: 41.2 % (ref 39.0–52.0)
Hemoglobin: 14.1 g/dL (ref 13.0–17.0)
Immature Granulocytes: 1 %
Lymphocytes Relative: 27 %
Lymphs Abs: 2.4 K/uL (ref 0.7–4.0)
MCH: 28.4 pg (ref 26.0–34.0)
MCHC: 34.2 g/dL (ref 30.0–36.0)
MCV: 82.9 fL (ref 80.0–100.0)
Monocytes Absolute: 0.6 K/uL (ref 0.1–1.0)
Monocytes Relative: 7 %
Neutro Abs: 5.8 K/uL (ref 1.7–7.7)
Neutrophils Relative %: 63 %
Platelets: 349 K/uL (ref 150–400)
RBC: 4.97 MIL/uL (ref 4.22–5.81)
RDW: 13.6 % (ref 11.5–15.5)
WBC: 9 K/uL (ref 4.0–10.5)
nRBC: 0 % (ref 0.0–0.2)

## 2023-10-31 LAB — COMPREHENSIVE METABOLIC PANEL WITH GFR
ALT: 27 U/L (ref 0–44)
AST: 23 U/L (ref 15–41)
Albumin: 4.6 g/dL (ref 3.5–5.0)
Alkaline Phosphatase: 111 U/L (ref 38–126)
Anion gap: 17 — ABNORMAL HIGH (ref 5–15)
BUN: 12 mg/dL (ref 6–20)
CO2: 20 mmol/L — ABNORMAL LOW (ref 22–32)
Calcium: 10.3 mg/dL (ref 8.9–10.3)
Chloride: 101 mmol/L (ref 98–111)
Creatinine, Ser: 0.95 mg/dL (ref 0.61–1.24)
GFR, Estimated: >60 mL/min (ref >60)
Glucose, Bld: 100 mg/dL — ABNORMAL HIGH (ref 70–99)
Potassium: 3.6 mmol/L (ref 3.5–5.1)
Sodium: 139 mmol/L (ref 135–145)
Total Bilirubin: 0.6 mg/dL (ref 0.0–1.2)
Total Protein: 7.8 g/dL (ref 6.5–8.1)

## 2023-10-31 LAB — D-DIMER, QUANTITATIVE: D-Dimer, Quant: <0.27 ug{FEU}/mL (ref 0.00–0.50)

## 2023-10-31 LAB — TROPONIN T, HIGH SENSITIVITY: Troponin T High Sensitivity: <15 ng/L (ref 0–19)

## 2023-10-31 MED ORDER — HYDROXYZINE HCL 25 MG PO TABS
25.0000 mg | ORAL_TABLET | Freq: Once | ORAL | Status: AC
  Administered 2023-10-31: 25 mg via ORAL
  Filled 2023-10-31: qty 1

## 2023-10-31 NOTE — ED Triage Notes (Signed)
 Pt POV reporting possible panic attack, tachycardic, R side facial numbness, SOB. Hx anxiety, not currently prescribed meds for same.

## 2023-10-31 NOTE — ED Provider Notes (Signed)
 [place] EMERGENCY DEPARTMENT AT [facility] Provider Note   CSN: [id number] Arrival date & time: 10/31/23  8048     Patient presents with: Panic Attack and Tachycardia   Aaron Wheeler is a 33 y.o. male with history of anxiety and panic attacks, obesity, presents with concern for recurrent panic attacks over the past 3 days.  He reports he had about 5 episodes today where he got short of breath, very anxious, and had some twitching in his face.  Denies any chest pain.  He also reports a blurred vision in his right eye when this occurs and has been present over the past 3 days.  He reports a right-sided headache as well.  No neck pain or fevers.  He states all symptoms are consistent with previous panic attacks except for the right eye blurred vision.  He denies any weakness in his upper or lower extremities bilaterally.  No paresthesias in the upper or lower extremities bilaterally.  He denies any suicidal homicidal ideation.   HPI     Prior to Admission medications   Medication Sig Start Date End Date Taking? Authorizing Provider  cyclobenzaprine (FLEXERIL) 10 MG tablet Take 0.5-1 tablets (5-10 mg total) by mouth at bedtime as needed for muscle spasms. 11/01/23  Yes Veta Palma, PA-C  hydrOXYzine  (ATARAX ) 25 MG tablet Take 1 tablet (25 mg total) by mouth every 6 (six) hours as needed for anxiety. 11/01/23  Yes Veta Palma, PA-C  fluticasone (FLONASE) 50 MCG/ACT nasal spray Place 1 spray into both nostrils daily.    [provider]  neomycin -polymyxin-hydrocortisone (CORTISPORIN) 3.5-10000-1 OTIC suspension Place 4 drops into the left ear 4 (four) times daily. 10/12/23   Geroldine Berg, MD  omeprazole (PRILOSEC) 20 MG capsule Take 20 mg by mouth daily.    [provider]  ondansetron  (ZOFRAN -ODT) 4 MG disintegrating tablet Take 1 tablet (4 mg total) by mouth every 8 (eight) hours as needed for nausea or vomiting. 06/30/23   Patsey Lot, MD   oxyCODONE  (ROXICODONE ) 5 MG immediate release tablet Take 1 tablet (5 mg total) by mouth every 6 (six) hours as needed for severe pain (pain score 7-10). 06/30/23   Patsey Lot, MD  pantoprazole  (PROTONIX ) 40 MG tablet Take 1 tablet (40 mg total) by mouth daily. 06/04/23   Butler, Devlyn C, MD  sertraline (ZOLOFT) 50 MG tablet Take 50 mg by mouth daily. 07/19/21   [provider]    Allergies: Ceclor [cefaclor] and Tylenol  [acetaminophen ]    Review of Systems  Psychiatric/Behavioral:  Negative for suicidal ideas.        Anxiety    Updated Vital Signs BP 109/62   Pulse 74   Temp 98.3 F (36.8 C) (Temporal)   Resp 11   Ht 5' 6 (1.676 m)   Wt (!) 172.4 kg   SpO2 97%   BMI 61.33 kg/m   Physical Exam Vitals and nursing note reviewed.  Constitutional:      General: He is not in acute distress.    Appearance: He is well-developed. He is obese.     Comments: Appears mildly anxious, but no acute distress  HENT:     Head: Normocephalic and atraumatic.  Eyes:     Extraocular Movements: Extraocular movements intact.     Conjunctiva/sclera: Conjunctivae normal.     Pupils: Pupils are equal, round, and reactive to light.     Comments: No signs of eye trauma.  No periorbital erythema or edema.  No erythema to the  conjunctiva.  Pupils equal round and reactive bilaterally.  Intact EOM.  No proptosis.  No hyphema, subchorionic hemorrhage, or other abnormality noted.  IOP bilaterally 11  Visual acuity in the right eye 20/50, visual acuity left eye 20/20, both eyes 20/20  Cardiovascular:     Rate and Rhythm: Normal rate and regular rhythm.     Heart sounds: No murmur heard. Pulmonary:     Effort: Pulmonary effort is normal. No respiratory distress.     Breath sounds: Normal breath sounds.  Abdominal:     Palpations: Abdomen is soft.     Tenderness: There is no abdominal tenderness.  Musculoskeletal:        General: No swelling.     Cervical back: Neck supple.  Skin:     General: Skin is warm and dry.     Capillary Refill: Capillary refill takes less than 2 seconds.  Neurological:     General: No focal deficit present.     Mental Status: He is alert and oriented to person, place, and time.     Comments: Mental status: Alert and oriented to self, place, and month  Speech: Answers questions appropriately  Cranial Nerves: III, IV, VI: EOM intact, Pupils equal round and reactive, no gaze preference or deviation, no nystagmus. V: normal sensation in V1, V2, and V3 segments bilaterally VII: smiles, puffs cheeks, raises eyebrows, and closes eyes without asymmetry.  VIII: normal hearing to speech IX, X: normal palatal elevation, no uvular deviation XI: 5/5 head turn and 5/5 shoulder shrug bilaterally XII: midline tongue protrusion  Motor: 5/5 strength with resisted elbow flexion extension bilaterally, wrist flexion and extension bilaterally, ankle plantarflexion and dorsiflexion bilaterally   Sensory: Intact sensation in upper and lower extremity bilaterally    Gait: Normal   Psychiatric:        Mood and Affect: Mood normal.     (all labs ordered are listed, but only abnormal results are displayed) Labs Reviewed  COMPREHENSIVE METABOLIC PANEL WITH GFR - Abnormal; Notable for the following components:      Result Value   CO2 20 (*)    Glucose, Bld 100 (*)    Anion gap 17 (*)    All other components within normal limits  CBC WITH DIFFERENTIAL/PLATELET  TSH  D-DIMER, QUANTITATIVE  CBG MONITORING, ED  TROPONIN T, HIGH SENSITIVITY    EKG: EKG Interpretation Date/Time:  Saturday October 31 2023 20:07:00 EDT Ventricular Rate:  115 PR Interval:  160 QRS Duration:  92 QT Interval:  311 QTC Calculation: 431 R Axis:   21  Text Interpretation: Sinus tachycardia Consider right atrial enlargement Confirmed by Trine Likes [phone]) on 11/01/2023 12:04:06 AM  Radiology: CT Head Wo Contrast Result Date: 10/31/2023 EXAM: CT HEAD WITHOUT  CONTRAST 10/31/2023 09:29:37 PM TECHNIQUE: CT of the head was performed without the administration of intravenous contrast. Automated exposure control, iterative reconstruction, and/or weight based adjustment of the mA/kV was utilized to reduce the radiation dose to as low as reasonably achievable. COMPARISON: CT head 08/21/2022. CLINICAL HISTORY: right eye blurred vision. possible panic attack, tachycardic, R side facial numbness, SOB. Hx anxiety. FINDINGS: BRAIN AND VENTRICLES: No acute hemorrhage. No evidence of acute infarct. No hydrocephalus. No extra-axial collection. No mass effect or midline shift. ORBITS: No acute abnormality. SINUSES: No acute abnormality. SOFT TISSUES AND SKULL: No acute soft tissue abnormality. No skull fracture. Chronic left mastoid effusion. IMPRESSION: 1. No acute intracranial abnormality related to the provided clinical history. Electronically signed by: Norman Gatlin MD  10/31/2023 09:38 PM EDT RP Workstation: [id number]     Procedures   Medications Ordered in the ED  ketorolac  (TORADOL ) 15 MG/ML injection 15 mg (has no administration in time range)  cyclobenzaprine (FLEXERIL) tablet 5 mg (has no administration in time range)  hydrOXYzine  (ATARAX ) tablet 25 mg (25 mg Oral Given 10/31/23 2131)    Clinical Course as of 11/01/23 0035  Sat Oct 31, 2023  2311 Rechecked patient, he is feeling much better and less anxious. [AF]  Sun Nov 01, 2023  0016 Right eye IOP 11 [AF]    Clinical Course User Index [AF] Veta Palma, PA-C                                 Medical Decision Making Amount and/or Complexity of Data Reviewed Labs: ordered. Radiology: ordered.  Risk Prescription drug management.     Differential diagnosis includes but is not limited to TIA, CVA, panic attack, anxiety, atypical migraine, DVT, PE, ACS  ED Course:  Upon initial evaluation, patient is anxious appearing, but no acute distress.  Stable vitals upon arrival.  He is reporting  recurrent panic attacks at home that are consistent with previous.  Also reporting some blurred vision in the right eye which started 3 days ago with his panic attacks.  Visual acuity in the right eye 20/50, left eye 20/20.  He does not have any other neurologic deficits on exam.  Did report some shortness of breath earlier with his panic attack, but this is since resolved.  No chest pain.  Labs Ordered: I Ordered, and personally interpreted labs.  The pertinent results include:   CBC within normal limits CMP with anion gap at 17, glucose 100, no other electrolyte abnormalities, no elevations in creatinine or LFTs D-dimer within normal limits Troponin within normal limits TSH within normal limits  Imaging Studies ordered: I ordered imaging studies including CT head I independently visualized the imaging with scope of interpretation limited to determining acute life threatening conditions related to emergency care. Imaging showed no acute abnormality I agree with the radiologist interpretation   Cardiac Monitoring: / EKG: The patient was maintained on a cardiac monitor.  I personally viewed and interpreted the cardiac monitored which showed an underlying rhythm of: EKG with sinus tachycardia upon arrival, but resolved to normal sinus rhythm without intervention.  Medications Given: Hydroxyzine  Toradol  for headache Flexeril for muscle spasms  Upon re-evaluation, patient reports anxiety symptoms are improved with the hydroxyzine  and he feels much better.  Low concern for ACS given no chest pain, troponin within normal limits, symptoms on and off for the past 3 days.  Low concern for DVT or PE given D-dimer within normal limits.  TSH within normal limits.  No significant electrolyte abnormalities on CMP.  Blood counts within normal limits.  Blood glucose upon arrival at 100, no signs of significant hyperglycemia or hypoglycemia.  CT head was obtained due to patient's right-sided visual deficit,  and this was unremarkable for any acute abnormalities.  I have low concern for CVA given he does not have any other neurologic deficits on exam.  His visual acuity in the right eye is decreased at 20/50 compared to 20/20 in the right eye.  IOP of the eyes bilaterally at 11, no concern for acute angle-closure glaucoma, or other acute abnormality.  No signs of eye infection such as preseptal or preseptal cellulitis, no erythema of the conjunctiva, no hypopyon.  Discussed case with my attending Dr. Trine who does not recommend any further workup here in the emergency room for patient's visual acuity change.  He recommends outpatient follow-up with ophthalmology.  All results discussed with patient.  Patient stable and appropriate for discharge home.  Impression: Panic attack Change in right eye visual acuity  Disposition:  The patient was discharged home with instructions to follow-up with ophthalmology Dr. Octavia or Dr. Grissom within the next 5 days for evaluation of his visual acuity change.  May take prescribed hydroxyzine  as needed for anxiety.  May take prescribed Flexeril before bed as needed for his facial muscle twitching.  He understands this medication may make him drowsy and to not drink alcohol or drive after taking this medication.  Follow-up with his PCP for further management of anxiety symptoms within the next week. Return precautions given.    This chart was dictated using voice recognition software, Dragon. Despite the best efforts of this provider to proofread and correct errors, errors may still occur which can change documentation meaning.       Final diagnoses:  Anxiety  Visual acuity reduced    ED Discharge Orders          Ordered    cyclobenzaprine (FLEXERIL) 10 MG tablet  At bedtime PRN        11/01/23 0023    hydrOXYzine  (ATARAX ) 25 MG tablet  Every 6 hours PRN        11/01/23 0023               Veta Palma, PA-C 11/01/23 0036    Yolande Lamar BROCKS, MD 11/07/23 2045

## 2023-11-01 MED ORDER — CYCLOBENZAPRINE HCL 10 MG PO TABS: 5.0000 mg | ORAL_TABLET | Freq: Every evening | ORAL | 0 refills | Status: AC | PRN

## 2023-11-01 MED ORDER — CYCLOBENZAPRINE HCL 5 MG PO TABS
5.0000 mg | ORAL_TABLET | Freq: Once | ORAL | Status: AC
  Administered 2023-11-01: 5 mg via ORAL
  Filled 2023-11-01: qty 1

## 2023-11-01 MED ORDER — HYDROXYZINE HCL 25 MG PO TABS: 25.0000 mg | ORAL_TABLET | Freq: Four times a day (QID) | ORAL | 0 refills | Status: AC | PRN

## 2023-11-01 MED ORDER — KETOROLAC TROMETHAMINE 15 MG/ML IJ SOLN
15.0000 mg | Freq: Once | INTRAMUSCULAR | Status: AC
  Administered 2023-11-01: 15 mg via INTRAVENOUS
  Filled 2023-11-01: qty 1

## 2023-11-01 NOTE — ED Notes (Signed)
 Reviewed discharge instructions, medications, and home care with pt. Pt verbalized understanding and had no further questions. Pt exited ED without complications.

## 2023-11-01 NOTE — Discharge Instructions (Addendum)
 The vision in your right eye was decreased compared to your left eye today.  You need to follow-up with one of the eye doctors, either Dr. Corbin or Dr. Octavia, within the next 5 days regarding your vision.  Their contact information is listed below.  The remainder of your workup was reassuring today.  It is likely that your symptoms were due to an anxiety attack.  You were given a medication here called hydroxyzine  to help with your anxiety.  You have been prescribed this medication for at home.  You may take 1 tablet of hydroxyzine  every 6 hours as needed for anxiety.  You have been prescribed a muscle relaxer called Flexeril (cyclobenzaprine). You may take 0.5 - 1 tablet (5-10mg ) before bed as needed for muscle pain. This medication can be sedating. Do not drive or operate heavy machinery after taking this medicine. Do not drink alcohol or take other sedating medications when taking this medicine for safety reasons.  Keep this out of reach of small children.     Your blood counts, electrolytes, kidney, and liver function were normal today.  Your blood test to look for any signs of a blood clot in your lungs was normal.  Your blood test to look for any signs of heart attack was normal.  The CT of your head did not show any acute abnormalities to explain your symptoms today such as stroke or infection.  Please follow-up with your PCP within the next week regarding your anxiety and for further management.  Please return to the ER if you have any one-sided arm or leg numbness or weakness, slurred speech, facial droop, unexplained fevers, worsening chest pain or shortness of breath, any other new or concerning symptoms

## 2023-11-01 NOTE — ED Notes (Signed)
 ED Provider at bedside.
